# Patient Record
Sex: Male | Born: 1985 | Race: White | Hispanic: No | Marital: Single | Smoking: Never smoker
Health system: Southern US, Community
[De-identification: ages and names within clinical notes are randomized; demographics above are authoritative.]

## PROBLEM LIST (undated history)

## (undated) DIAGNOSIS — J45909 Unspecified asthma, uncomplicated: Secondary | ICD-10-CM

## (undated) HISTORY — DX: Unspecified asthma, uncomplicated: J45.909

---
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
## 2016-07-31 HISTORY — PX: REFRACTIVE SURGERY: SHX103

## 2019-01-01 ENCOUNTER — Other Ambulatory Visit: Payer: Self-pay

## 2019-01-01 ENCOUNTER — Ambulatory Visit (INDEPENDENT_AMBULATORY_CARE_PROVIDER_SITE_OTHER): Payer: Managed Care, Other (non HMO) | Admitting: Allergy

## 2019-01-01 ENCOUNTER — Encounter: Payer: Self-pay | Admitting: Allergy

## 2019-01-01 VITALS — BP 110/60 | HR 69 | Temp 98.3°F | Resp 16 | Ht 69.0 in | Wt 150.0 lb

## 2019-01-01 DIAGNOSIS — J302 Other seasonal allergic rhinitis: Secondary | ICD-10-CM

## 2019-01-01 DIAGNOSIS — J3089 Other allergic rhinitis: Secondary | ICD-10-CM | POA: Diagnosis not present

## 2019-01-01 DIAGNOSIS — Z8709 Personal history of other diseases of the respiratory system: Secondary | ICD-10-CM

## 2019-01-01 MED ORDER — EPINEPHRINE 0.3 MG/0.3ML IJ SOAJ: 0.3000 mg | Freq: Once | INTRAMUSCULAR | 2 refills | Status: AC

## 2019-01-01 NOTE — Progress Notes (Signed)
New Patient Note  RE: Matthew Jensen MRN: [id number] DOB: [birth year] Date of Office Visit: 01/01/2019  Referring provider: No ref. provider found Primary care provider: Patient, No Pcp Per  Chief Complaint: Allergic Rhinitis  (continue allergy shots )  History of Present Illness: I had the pleasure of seeing Matthew Jensen for initial evaluation at the [facility] on 01/02/2019. He is a 33 y.o. male, who is self-referred here for the evaluation of establishing care with allergist as he recently moved to the area.   Allergic rhinitis:  Patient has been on allergy injections for about 1.5 years with good benefit - started in October 2018. No reactions to the allergy injections. Last injection was on 12/04/2018 and was on the monthly injection for the past 6 months or so and is due for injection today. Patient was on allergy injections as a child for 1 year perhaps as well.   He reports symptoms of nasal congestion, PND. Symptoms have been going on for 20+ years. The symptoms are present all year around with worsening in spring. Other triggers include exposure to dust. Anosmia: no. Headache: sometimes. He has used Flonase 1 spray daily with fair improvement in symptoms. Used to take Singulair in the past. Sinus infections: none in the past year. Previous work up includes: skin testing in 2018 was positive to trees, grass, weed, ragweed, mold, dust mites, cat, dog, cockroach. Previous ENT evaluation: not recently. Previous sinus imaging: no. Last eye exam: over 1 year ago. History of nasal polyps: no.  Red vial 1:100 T, G, W Red vial 1:100 MT, MD, C, D, Cr  Assessment and Plan: Matthew Jensen is a 33 y.o. male with: Seasonal and perennial allergic rhinitis Perennial rhinitis symptoms for the past 20+ years with worsening in the spring.  2018 skin testing was positive to trees, grass, weed, ragweed, mold, dust mites, cat, dog and cockroach.  Started on allergy injections in  October 2018 and has been doing well on it with no reactions.  On monthly injections for the past 6 months (T,G,W + DM,M,C,D,Cr)  No indication for any repeat testing - see scanned records from previous allergist.   Will continue allergy immunotherapy. Gave injection today from his old vials.  Consent form was signed. Reviewed risks and benefits.  I have prescribed epinephrine injectable and demonstrated proper use. For mild symptoms you can take over the counter antihistamines such as Benadryl and monitor symptoms closely. If symptoms worsen or if you have severe symptoms including breathing issues, throat closure, significant swelling, whole body hives, severe diarrhea and vomiting, lightheadedness then inject epinephrine and seek immediate medical care afterwards.  Discussed that we will need to do a few months of build up with our vial before resuming every 4 week injections as some of our serums are different than his previous allergist.  Will start with Gold vial on schedule B. Once reached red vial 0.36ml then may resume every 4 weeks injections.   Continue Flonase 1 spray 1-2 times a day for nasal congestion. Demonstrated proper use.   Nasal saline spray (i.e., Simply Saline) or nasal saline lavage (i.e., NeilMed) is recommended as needed and prior to medicated nasal sprays.  May use over the counter antihistamines such as Zyrtec (cetirizine), Claritin (loratadine), Allegra (fexofenadine), or Xyzal (levocetirizine) daily as needed.  Start environmental control measures for pollen, dust mites, mold, cockroaches, pet dander.  History of asthma History of asthma as a child which has been dormant until diagnosed with  pneumonia a few years ago and required albuterol during that time.  Today's spirometry was normal.  May use albuterol rescue inhaler 2 puffs or nebulizer every 4 to 6 hours as needed for shortness of breath, chest tightness, coughing, and wheezing. May use albuterol rescue  inhaler 2 puffs 5 to 15 minutes prior to strenuous physical activities. Monitor frequency of use.   Return in about 6 months (around 07/03/2019).  Meds ordered this encounter  Medications  . EPINEPHrine (EPIPEN 2-PAK) 0.3 mg/0.3 mL IJ SOAJ injection    Sig: Inject 0.3 mLs (0.3 mg total) into the muscle once for 1 dose.    Dispense:  2 Device    Refill:  2   Other allergy screening: Asthma: used to have asthma as a child  Asymptomatic until he had pneumonia. Denies any SOB, coughing, wheezing, chest tightness, nocturnal awakenings.  Rhino conjunctivitis: yes Food allergy: no Medication allergy: no Hymenoptera allergy: no Urticaria: no Eczema:no History of recurrent infections suggestive of immunodeficency: no  Diagnostics: Spirometry:  Tracings reviewed. His effort: Good reproducible efforts. FVC: 4.89L FEV1: 3.46L, 80% predicted FEV1/FVC ratio: 71% Interpretation: Spirometry consistent with normal pattern.  Please see scanned spirometry results for details.  Past Medical History: Patient Active Problem List   Diagnosis Date Noted  . Seasonal and perennial allergic rhinitis 01/01/2019  . History of asthma 01/01/2019   Past Medical History:  Diagnosis Date  . Asthma    Past Surgical History: History reviewed. No pertinent surgical history. Medication List:  Current Outpatient Medications  Medication Sig Dispense Refill  . fluticasone (FLONASE) 50 MCG/ACT nasal spray Place 1 spray into both nostrils daily.    Marland Kitchen guaiFENesin (MUCINEX) 600 MG 12 hr tablet Take 600 mg by mouth 2 (two) times daily.     No current facility-administered medications for this visit.    Allergies: No Known Allergies Social History: Social History   Socioeconomic History  . Marital status: Single    Spouse name: Not on file  . Number of children: Not on file  . Years of education: Not on file  . Highest education level: Not on file  Occupational History  . Not on file  Social Needs   . Financial resource strain: Not on file  . Food insecurity:    Worry: Not on file    Inability: Not on file  . Transportation needs:    Medical: Not on file    Non-medical: Not on file  Tobacco Use  . Smoking status: Never Smoker  . Smokeless tobacco: Never Used  Substance and Sexual Activity  . Alcohol use: Never    Frequency: Never  . Drug use: Never  . Sexual activity: Never  Lifestyle  . Physical activity:    Days per week: Not on file    Minutes per session: Not on file  . Stress: Not on file  Relationships  . Social connections:    Talks on phone: Not on file    Gets together: Not on file    Attends religious service: Not on file    Active member of club or organization: Not on file    Attends meetings of clubs or organizations: Not on file    Relationship status: Not on file  Other Topics Concern  . Not on file  Social History Narrative  . Not on file   Lives in a newer apartment. Smoking: denies Occupation: Facilities manager History: Water Damage/mildew in the house: no Carpet in the family room: no  Carpet in the bedroom: yes Heating: electric Cooling: central Pet: yes 1 cat x 1.5 year ago  Family History: Family History  Problem Relation Age of Onset  . Asthma Mother   . Asthma Brother    Problem                               Relation Asthma                                   Mother, brother Eczema                                No  Food allergy                          No  Allergic rhino conjunctivitis     Father, mother   Review of Systems  Constitutional: Negative for appetite change, chills, fever and unexpected weight change.  HENT: Positive for congestion. Negative for rhinorrhea.   Eyes: Negative for itching.  Respiratory: Negative for cough, chest tightness, shortness of breath and wheezing.   Cardiovascular: Negative for chest pain.  Gastrointestinal: Negative for abdominal pain.  Genitourinary: Negative for difficulty  urinating.  Skin: Negative for rash.  Allergic/Immunologic: Positive for environmental allergies. Negative for food allergies.  Neurological: Positive for headaches.   Objective: BP 110/60   Pulse 69   Temp 98.3 F (36.8 C) (Tympanic)   Resp 16   Ht 5\' 9"  (1.753 m)   Wt 150 lb (68 kg)   SpO2 97%   BMI 22.15 kg/m  Body mass index is 22.15 kg/m. Physical Exam  Constitutional: He is oriented to person, place, and time. He appears well-developed and well-nourished.  HENT:  Head: Normocephalic and atraumatic.  Right Ear: External ear normal.  Left Ear: External ear normal.  Nose: Nose normal.  Mouth/Throat: Oropharynx is clear and moist.  Eyes: Conjunctivae and EOM are normal.  Neck: Neck supple.  Cardiovascular: Normal rate, regular rhythm and normal heart sounds. Exam reveals no gallop and no friction rub.  No murmur heard. Pulmonary/Chest: Effort normal and breath sounds normal. He has no wheezes. He has no rales.  Abdominal: Soft.  Neurological: He is alert and oriented to person, place, and time.  Skin: Skin is warm. No rash noted.  Psychiatric: He has a normal mood and affect. His behavior is normal.  Nursing note and vitals reviewed.  The plan was reviewed with the patient/family, and all questions/concerned were addressed.  It was my pleasure to see Matthew NeedleMichael today and participate in his care. Please feel free to contact me with any questions or concerns.  Sincerely,  Wyline MoodYoon Seraphim Trow, DO Allergy & Immunology  [facility] [place] [place] office: [phone] [facility] office: [phone]

## 2019-01-01 NOTE — Patient Instructions (Addendum)
We will continue your allergy shots here. Will need 1-2 months of build up most likely.  I have prescribed epinephrine injectable and demonstrated proper use. For mild symptoms you can take over the counter antihistamines such as Benadryl and monitor symptoms closely. If symptoms worsen or if you have severe symptoms including breathing issues, throat closure, significant swelling, whole body hives, severe diarrhea and vomiting, lightheadedness then inject epinephrine and seek immediate medical care afterwards.  Continue Flonase 1 spray 1-2 times a day for nasal congestion. Nasal saline spray (i.e., Simply Saline) or nasal saline lavage (i.e., NeilMed) is recommended as needed and prior to medicated nasal sprays. May use over the counter antihistamines such as Zyrtec (cetirizine), Claritin (loratadine), Allegra (fexofenadine), or Xyzal (levocetirizine) daily as needed. Start environmental control measures for pollen, dust mites, mold, cockroaches, pet dander.  May use albuterol rescue inhaler 2 puffs or nebulizer every 4 to 6 hours as needed for shortness of breath, chest tightness, coughing, and wheezing. May use albuterol rescue inhaler 2 puffs 5 to 15 minutes prior to strenuous physical activities. Monitor frequency of use.    Follow up in 6 months - sooner if needed.  Reducing Pollen Exposure . Pollen seasons: trees (spring), grass (summer) and ragweed/weeds (fall). Marland Kitchen. Keep windows closed in your home and car to lower pollen exposure.  Lilian Kapur. Install air conditioning in the bedroom and throughout the house if possible.  . Avoid going out in dry windy days - especially early morning. . Pollen counts are highest between 5 - 10 AM and on dry, hot and windy days.  . Save outside activities for late afternoon or after a heavy rain, when pollen levels are lower.  . Avoid mowing of grass if you have grass pollen allergy. Marland Kitchen. Be aware that pollen can also be transported indoors on people and pets.  . Dry  your clothes in an automatic dryer rather than hanging them outside where they might collect pollen.  . Rinse hair and eyes before bedtime.  Control of House Dust Mite Allergen . Dust mite allergens are a common trigger of allergy and asthma symptoms. While they can be found throughout the house, these microscopic creatures thrive in warm, humid environments such as bedding, upholstered furniture and carpeting. . Because so much time is spent in the bedroom, it is essential to reduce mite levels there.  . Encase pillows, mattresses, and box springs in special allergen-proof fabric covers or airtight, zippered plastic covers.  . Bedding should be washed weekly in hot water (130 F) and dried in a hot dryer. Allergen-proof covers are available for comforters and pillows that can't be regularly washed.  Reyes Ivan. Wash the allergy-proof covers every few months. Minimize clutter in the bedroom. Keep pets out of the bedroom.  Marland Kitchen. Keep humidity less than 50% by using a dehumidifier or air conditioning. You can buy a humidity measuring device called a hygrometer to monitor this.  . If possible, replace carpets with hardwood, linoleum, or washable area rugs. If that's not possible, vacuum frequently with a vacuum that has a HEPA filter. . Remove all upholstered furniture and non-washable window drapes from the bedroom. . Remove all non-washable stuffed toys from the bedroom.  Wash stuffed toys weekly. Mold Control . Mold and fungi can grow on a variety of surfaces provided certain temperature and moisture conditions exist.  . Outdoor molds grow on plants, decaying vegetation and soil. The major outdoor mold, Alternaria and Cladosporium, are found in very high numbers during hot and  dry conditions. Generally, a late summer - fall peak is seen for common outdoor fungal spores. Rain will temporarily lower outdoor mold spore count, but counts rise rapidly when the rainy period ends. . The most important indoor molds are  Aspergillus and Penicillium. Dark, humid and poorly ventilated basements are ideal sites for mold growth. The next most common sites of mold growth are the bathroom and the kitchen. Outdoor (Seasonal) Mold Control . Use air conditioning and keep windows closed. . Avoid exposure to decaying vegetation. Marland Kitchen Avoid leaf raking. . Avoid grain handling. . Consider wearing a face mask if working in moldy areas.  Indoor (Perennial) Mold Control  . Maintain humidity below 50%. . Get rid of mold growth on hard surfaces with water, detergent and, if necessary, 5% bleach (do not mix with other cleaners). Then dry the area completely. If mold covers an area more than 10 square feet, consider hiring an indoor environmental professional. . For clothing, washing with soap and water is best. If moldy items cannot be cleaned and dried, throw them away. . Remove sources e.g. contaminated carpets. . Repair and seal leaking roofs or pipes. Using dehumidifiers in damp basements may be helpful, but empty the water and clean units regularly to prevent mildew from forming. All rooms, especially basements, bathrooms and kitchens, require ventilation and cleaning to deter mold and mildew growth. Avoid carpeting on concrete or damp floors, and storing items in damp areas. Cockroach Allergen Avoidance Cockroaches are often found in the homes of densely populated urban areas, schools or commercial buildings, but these creatures can lurk almost anywhere. This does not mean that you have a dirty house or living area. . Block all areas where roaches can enter the home. This includes crevices, wall cracks and windows.  . Cockroaches need water to survive, so fix and seal all leaky faucets and pipes. Have an exterminator go through the house when your family and pets are gone to eliminate any remaining roaches. Marland Kitchen Keep food in lidded containers and put pet food dishes away after your pets are done eating. Vacuum and sweep the floor  after meals, and take out garbage and recyclables. Use lidded garbage containers in the kitchen. Wash dishes immediately after use and clean under stoves, refrigerators or toasters where crumbs can accumulate. Wipe off the stove and other kitchen surfaces and cupboards regularly. Pet Allergen Avoidance: . Contrary to popular opinion, there are no "hypoallergenic" breeds of dogs or cats. That is because people are not allergic to an animal's hair, but to an allergen found in the animal's saliva, dander (dead skin flakes) or urine. Pet allergy symptoms typically occur within minutes. For some people, symptoms can build up and become most severe 8 to 12 hours after contact with the animal. People with severe allergies can experience reactions in public places if dander has been transported on the pet owners' clothing. Marland Kitchen Keeping an animal outdoors is only a partial solution, since homes with pets in the yard still have higher concentrations of animal allergens. . Before getting a pet, ask your allergist to determine if you are allergic to animals. If your pet is already considered part of your family, try to minimize contact and keep the pet out of the bedroom and other rooms where you spend a great deal of time. . As with dust mites, vacuum carpets often or replace carpet with a hardwood floor, tile or linoleum. . High-efficiency particulate air (HEPA) cleaners can reduce allergen levels over time. . While  dander and saliva are the source of cat and dog allergens, urine is the source of allergens from rabbits, hamsters, mice and [place] pigs; so ask a non-allergic family member to clean the animal's cage. . If you have a pet allergy, talk to your allergist about the potential for allergy immunotherapy (allergy shots). This strategy can often provide long-term relief.

## 2019-01-02 ENCOUNTER — Encounter: Payer: Self-pay | Admitting: Allergy

## 2019-01-02 NOTE — Assessment & Plan Note (Addendum)
Perennial rhinitis symptoms for the past 20+ years with worsening in the spring.  2018 skin testing was positive to trees, grass, weed, ragweed, mold, dust mites, cat, dog and cockroach.  Started on allergy injections in October 2018 and has been doing well on it with no reactions.  On monthly injections for the past 6 months (T,G,W + DM,M,C,D,Cr)  No indication for any repeat testing - see scanned records from previous allergist.   Will continue allergy immunotherapy. Gave injection today from his old vials.  Consent form was signed. Reviewed risks and benefits.  I have prescribed epinephrine injectable and demonstrated proper use. For mild symptoms you can take over the counter antihistamines such as Benadryl and monitor symptoms closely. If symptoms worsen or if you have severe symptoms including breathing issues, throat closure, significant swelling, whole body hives, severe diarrhea and vomiting, lightheadedness then inject epinephrine and seek immediate medical care afterwards.  Discussed that we will need to do a few months of build up with our vial before resuming every 4 week injections as some of our serums are different than his previous allergist.  Will start with Gold vial on schedule B. Once reached red vial 0.87ml then may resume every 4 weeks injections.   Continue Flonase 1 spray 1-2 times a day for nasal congestion. Demonstrated proper use.   Nasal saline spray (i.e., Simply Saline) or nasal saline lavage (i.e., NeilMed) is recommended as needed and prior to medicated nasal sprays.  May use over the counter antihistamines such as Zyrtec (cetirizine), Claritin (loratadine), Allegra (fexofenadine), or Xyzal (levocetirizine) daily as needed.  Start environmental control measures for pollen, dust mites, mold, cockroaches, pet dander.

## 2019-01-02 NOTE — Assessment & Plan Note (Signed)
History of asthma as a child which has been dormant until diagnosed with pneumonia a few years ago and required albuterol during that time.  Today's spirometry was normal.  May use albuterol rescue inhaler 2 puffs or nebulizer every 4 to 6 hours as needed for shortness of breath, chest tightness, coughing, and wheezing. May use albuterol rescue inhaler 2 puffs 5 to 15 minutes prior to strenuous physical activities. Monitor frequency of use.

## 2019-01-16 DIAGNOSIS — J301 Allergic rhinitis due to pollen: Secondary | ICD-10-CM | POA: Diagnosis not present

## 2019-01-16 NOTE — Progress Notes (Signed)
Mar-Mac EXP 01-16-2020

## 2019-01-22 DIAGNOSIS — J3089 Other allergic rhinitis: Secondary | ICD-10-CM | POA: Diagnosis not present

## 2019-01-23 ENCOUNTER — Ambulatory Visit (INDEPENDENT_AMBULATORY_CARE_PROVIDER_SITE_OTHER): Payer: Managed Care, Other (non HMO)

## 2019-01-23 ENCOUNTER — Other Ambulatory Visit: Payer: Self-pay

## 2019-01-23 DIAGNOSIS — J309 Allergic rhinitis, unspecified: Secondary | ICD-10-CM | POA: Diagnosis not present

## 2019-01-23 MED ORDER — EPINEPHRINE 0.3 MG/0.3ML IJ SOAJ: 0.3000 mg | Freq: Once | INTRAMUSCULAR | 1 refills | Status: AC

## 2019-01-23 NOTE — Progress Notes (Signed)
Immunotherapy   Patient Details  Name: Matthew Jensen MRN: [id number] Date of Birth: 11/29/1985  01/23/2019  Edythe Clarity  Started allergy injections. Patient received 0.05 of both his yellow/gold vials. One with G-W-T and the other with M-DM-C-D-CR. Patient waited 30 minutes in an exam room with no problems. Following schedule: B Frequency: 1-2 tines weekly Epi-Pen: Yes Consent signed and patient instructions given.   Herbie Drape 01/23/2019, 2:29 PM

## 2019-01-28 ENCOUNTER — Ambulatory Visit (INDEPENDENT_AMBULATORY_CARE_PROVIDER_SITE_OTHER): Payer: Managed Care, Other (non HMO) | Admitting: *Deleted

## 2019-01-28 DIAGNOSIS — J309 Allergic rhinitis, unspecified: Secondary | ICD-10-CM

## 2019-02-04 ENCOUNTER — Ambulatory Visit (INDEPENDENT_AMBULATORY_CARE_PROVIDER_SITE_OTHER): Payer: Managed Care, Other (non HMO) | Admitting: *Deleted

## 2019-02-04 DIAGNOSIS — J309 Allergic rhinitis, unspecified: Secondary | ICD-10-CM | POA: Diagnosis not present

## 2019-02-11 ENCOUNTER — Ambulatory Visit (INDEPENDENT_AMBULATORY_CARE_PROVIDER_SITE_OTHER): Payer: Managed Care, Other (non HMO) | Admitting: *Deleted

## 2019-02-11 DIAGNOSIS — J309 Allergic rhinitis, unspecified: Secondary | ICD-10-CM

## 2019-02-21 ENCOUNTER — Ambulatory Visit (INDEPENDENT_AMBULATORY_CARE_PROVIDER_SITE_OTHER): Payer: Managed Care, Other (non HMO) | Admitting: *Deleted

## 2019-02-21 DIAGNOSIS — J309 Allergic rhinitis, unspecified: Secondary | ICD-10-CM

## 2019-02-27 ENCOUNTER — Ambulatory Visit (INDEPENDENT_AMBULATORY_CARE_PROVIDER_SITE_OTHER): Payer: Managed Care, Other (non HMO)

## 2019-02-27 DIAGNOSIS — J309 Allergic rhinitis, unspecified: Secondary | ICD-10-CM | POA: Diagnosis not present

## 2019-03-04 ENCOUNTER — Ambulatory Visit (INDEPENDENT_AMBULATORY_CARE_PROVIDER_SITE_OTHER): Payer: Managed Care, Other (non HMO) | Admitting: *Deleted

## 2019-03-04 DIAGNOSIS — J309 Allergic rhinitis, unspecified: Secondary | ICD-10-CM

## 2019-03-11 ENCOUNTER — Ambulatory Visit (INDEPENDENT_AMBULATORY_CARE_PROVIDER_SITE_OTHER): Payer: Managed Care, Other (non HMO) | Admitting: *Deleted

## 2019-03-11 DIAGNOSIS — J309 Allergic rhinitis, unspecified: Secondary | ICD-10-CM

## 2019-03-18 ENCOUNTER — Ambulatory Visit (INDEPENDENT_AMBULATORY_CARE_PROVIDER_SITE_OTHER): Payer: Managed Care, Other (non HMO) | Admitting: *Deleted

## 2019-03-18 DIAGNOSIS — J309 Allergic rhinitis, unspecified: Secondary | ICD-10-CM

## 2019-03-25 ENCOUNTER — Ambulatory Visit (INDEPENDENT_AMBULATORY_CARE_PROVIDER_SITE_OTHER): Payer: Managed Care, Other (non HMO) | Admitting: *Deleted

## 2019-03-25 DIAGNOSIS — J309 Allergic rhinitis, unspecified: Secondary | ICD-10-CM

## 2019-04-03 ENCOUNTER — Ambulatory Visit (INDEPENDENT_AMBULATORY_CARE_PROVIDER_SITE_OTHER): Payer: Managed Care, Other (non HMO) | Admitting: *Deleted

## 2019-04-03 ENCOUNTER — Other Ambulatory Visit: Payer: Self-pay

## 2019-04-03 ENCOUNTER — Emergency Department (HOSPITAL_COMMUNITY): Payer: Managed Care, Other (non HMO)

## 2019-04-03 ENCOUNTER — Emergency Department (HOSPITAL_COMMUNITY)
Admission: EM | Admit: 2019-04-03 | Discharge: 2019-04-03 | Disposition: A | Discharge status: Home / Self Care | Payer: Managed Care, Other (non HMO) | Attending: Emergency Medicine | Admitting: Emergency Medicine

## 2019-04-03 DIAGNOSIS — R001 Bradycardia, unspecified: Secondary | ICD-10-CM | POA: Diagnosis not present

## 2019-04-03 DIAGNOSIS — J309 Allergic rhinitis, unspecified: Secondary | ICD-10-CM

## 2019-04-03 DIAGNOSIS — Z79899 Other long term (current) drug therapy: Secondary | ICD-10-CM | POA: Insufficient documentation

## 2019-04-03 DIAGNOSIS — J45909 Unspecified asthma, uncomplicated: Secondary | ICD-10-CM | POA: Diagnosis not present

## 2019-04-03 DIAGNOSIS — R0789 Other chest pain: Secondary | ICD-10-CM

## 2019-04-03 LAB — BASIC METABOLIC PANEL
Anion gap: 10 (ref 5–15)
BUN: 17 mg/dL (ref 6–20)
CO2: 26 mmol/L (ref 22–32)
Calcium: 9.2 mg/dL (ref 8.9–10.3)
Chloride: 103 mmol/L (ref 98–111)
Creatinine, Ser: 0.89 mg/dL (ref 0.61–1.24)
GFR calc Af Amer: >60 mL/min (ref >60)
GFR calc non Af Amer: >60 mL/min (ref >60)
Glucose, Bld: 92 mg/dL (ref 70–99)
Potassium: 4 mmol/L (ref 3.5–5.1)
Sodium: 139 mmol/L (ref 135–145)

## 2019-04-03 LAB — TROPONIN I (HIGH SENSITIVITY)
Troponin I (High Sensitivity): 4 ng/L (ref <18)
Troponin I (High Sensitivity): 4 ng/L (ref <18)

## 2019-04-03 LAB — CBC
HCT: 42.9 % (ref 39.0–52.0)
Hemoglobin: 13.9 g/dL (ref 13.0–17.0)
MCH: 31.6 pg (ref 26.0–34.0)
MCHC: 32.4 g/dL (ref 30.0–36.0)
MCV: 97.5 fL (ref 80.0–100.0)
Platelets: 151 10*3/uL (ref 150–400)
RBC: 4.4 MIL/uL (ref 4.22–5.81)
RDW: 12.3 % (ref 11.5–15.5)
WBC: 5.2 10*3/uL (ref 4.0–10.5)
nRBC: 0 % (ref 0.0–0.2)

## 2019-04-03 MED ORDER — SODIUM CHLORIDE 0.9% FLUSH: 3.0000 mL | Freq: Once | INTRAVENOUS | Status: DC

## 2019-04-03 NOTE — ED Triage Notes (Signed)
Patient with chest pain that started last night after scuba diving last night.  Patient stated that it did go away around 2030 last night.  Pain returned this morning after going to the bathroom around 0430.  It is sharp in nature, with a pressure under left breast/substernal.  No shortness of breath, no nausea or vomiting.

## 2019-04-03 NOTE — ED Notes (Signed)
Patient Alert and oriented to baseline. Stable and ambulatory to baseline. Patient verbalized understanding of the discharge instructions.  Patient belongings were taken by the patient. Ambulated to lobby without complication.

## 2019-04-03 NOTE — ED Provider Notes (Signed)
[facility]   CSN: [id number] Arrival date & time: 04/03/19  [id number]     History   Chief Complaint Chief Complaint  Patient presents with  . Chest Pain    HPI Emeline GinsMichael Diebel is a 33 y.o. male.     Pt reports he had a sharp pain in his left chest last pm after scuba diving.  Pt reports he was diving in an 8 foot pool. Pt reports he had pain last pm and then pain reoccurred this am.  No current pain.  Pt denies shortness of breath.  No fever, no chiils.  Pt reports he had a similar episode several years ago and had a stress test which was normal.  Pt denies any other complaint. No change with exertion. No medications taken   The history is provided by the patient. No language interpreter was used.    Past Medical History:  Diagnosis Date  . Asthma     Patient Active Problem List   Diagnosis Date Noted  . Seasonal and perennial allergic rhinitis 01/01/2019  . History of asthma 01/01/2019    No past surgical history on file.      Home Medications    Prior to Admission medications   Medication Sig Start Date End Date Taking? Authorizing Provider  fluticasone (FLONASE) 50 MCG/ACT nasal spray Place 1 spray into both nostrils daily.    [provider]  guaiFENesin (MUCINEX) 600 MG 12 hr tablet Take 600 mg by mouth 2 (two) times daily.    [provider]    Family History Family History  Problem Relation Age of Onset  . Asthma Mother   . Asthma Brother     Social History Social History   Tobacco Use  . Smoking status: Never Smoker  . Smokeless tobacco: Never Used  Substance Use Topics  . Alcohol use: Never    Frequency: Never  . Drug use: Never     Allergies   Patient has no known allergies.   Review of Systems Review of Systems  All other systems reviewed and are negative.    Physical Exam Updated Vital Signs BP 113/61   Pulse (!) 42   Temp 98.4 F (36.9 C) (Oral)   Resp 12    Ht 5\' 9"  (1.753 m)   Wt 68 kg   SpO2 100%   BMI 22.15 kg/m   Physical Exam Vitals signs and nursing note reviewed.  Constitutional:      Appearance: He is well-developed.  HENT:     Head: Normocephalic and atraumatic.  Eyes:     Conjunctiva/sclera: Conjunctivae normal.  Neck:     Musculoskeletal: Normal range of motion and neck supple.  Cardiovascular:     Rate and Rhythm: Regular rhythm. Bradycardia present.     Heart sounds: Normal heart sounds. No murmur.  Pulmonary:     Effort: Pulmonary effort is normal. No respiratory distress.     Breath sounds: Normal breath sounds. No decreased breath sounds.  Abdominal:     Palpations: Abdomen is soft.     Tenderness: There is no abdominal tenderness.  Musculoskeletal: Normal range of motion.  Skin:    General: Skin is warm and dry.  Neurological:     General: No focal deficit present.     Mental Status: He is alert.  Psychiatric:        Mood and Affect: Mood normal.      ED Treatments / Results  Labs (all  labs ordered are listed, but only abnormal results are displayed) Labs Reviewed  BASIC METABOLIC PANEL  CBC  TROPONIN I (HIGH SENSITIVITY)  TROPONIN I (HIGH SENSITIVITY)    EKG EKG Interpretation  Date/Time:  Thursday April 03 2019 06:38:17 EDT Ventricular Rate:  48 PR Interval:  178 QRS Duration: 100 QT Interval:  406 QTC Calculation: 362 R Axis:   95 Text Interpretation:  Sinus bradycardia with sinus arrhythmia Rightward axis Borderline ECG No old tracing to compare Confirmed by Daleen Bo [phone]) on 04/03/2019 9:32:01 AM   Radiology Dg Chest 2 View  Result Date: 04/03/2019 CLINICAL DATA:  Chest pain. Patient reports onset of chest pain after scuba diving last night. EXAM: CHEST - 2 VIEW COMPARISON:  None FINDINGS: The heart size and mediastinal contours are within normal limits. Both lungs are clear. The visualized skeletal structures are unremarkable. IMPRESSION: Negative one-view chest x-ray  Electronically Signed   By: San Morelle M.D.   On: 04/03/2019 07:03    Procedures Procedures (including critical care time)  Medications Ordered in ED Medications  sodium chloride flush (NS) 0.9 % injection 3 mL (has no administration in time range)     Initial Impression / Assessment and Plan / ED Course  I have reviewed the triage vital signs and the nursing notes.  Pertinent labs & imaging results that were available during my care of the patient were reviewed by me and considered in my medical decision making (see chart for details).        MDM  Pt has 2 negative troponins.  He is chest pain free.  Pt's chest xray is normal, labs are normal.  I doubt acute coronary issue.  Pt is not dizzy.  I spoke with Cardiology.  Pt will receive a call to schedule cardiology evaluation for braycardia   Final Clinical Impressions(s) / ED Diagnoses   Final diagnoses:  Atypical chest pain  Bradycardia    ED Discharge Orders    None    An After Visit Summary was printed and given to the patient.    Fransico Meadow, PA-C 04/03/19 1154    Daleen Bo, MD 04/05/19 [phone]

## 2019-04-03 NOTE — Discharge Instructions (Signed)
[facility] will call you to schedule appointment for evaluation.  Return if any problems.

## 2019-04-10 ENCOUNTER — Ambulatory Visit (INDEPENDENT_AMBULATORY_CARE_PROVIDER_SITE_OTHER): Payer: Managed Care, Other (non HMO) | Admitting: *Deleted

## 2019-04-10 DIAGNOSIS — J309 Allergic rhinitis, unspecified: Secondary | ICD-10-CM

## 2019-04-14 ENCOUNTER — Ambulatory Visit: Payer: Managed Care, Other (non HMO) | Admitting: Cardiology

## 2019-04-14 ENCOUNTER — Ambulatory Visit (INDEPENDENT_AMBULATORY_CARE_PROVIDER_SITE_OTHER): Payer: Managed Care, Other (non HMO) | Admitting: *Deleted

## 2019-04-14 DIAGNOSIS — J309 Allergic rhinitis, unspecified: Secondary | ICD-10-CM

## 2019-04-24 ENCOUNTER — Ambulatory Visit (INDEPENDENT_AMBULATORY_CARE_PROVIDER_SITE_OTHER): Payer: Managed Care, Other (non HMO)

## 2019-04-24 DIAGNOSIS — J309 Allergic rhinitis, unspecified: Secondary | ICD-10-CM

## 2019-05-01 ENCOUNTER — Ambulatory Visit (INDEPENDENT_AMBULATORY_CARE_PROVIDER_SITE_OTHER): Payer: Managed Care, Other (non HMO) | Admitting: *Deleted

## 2019-05-01 DIAGNOSIS — J309 Allergic rhinitis, unspecified: Secondary | ICD-10-CM | POA: Diagnosis not present

## 2019-05-05 ENCOUNTER — Ambulatory Visit (INDEPENDENT_AMBULATORY_CARE_PROVIDER_SITE_OTHER): Payer: Managed Care, Other (non HMO) | Admitting: *Deleted

## 2019-05-05 DIAGNOSIS — J309 Allergic rhinitis, unspecified: Secondary | ICD-10-CM | POA: Diagnosis not present

## 2019-05-15 ENCOUNTER — Ambulatory Visit (INDEPENDENT_AMBULATORY_CARE_PROVIDER_SITE_OTHER): Payer: Managed Care, Other (non HMO)

## 2019-05-15 DIAGNOSIS — J309 Allergic rhinitis, unspecified: Secondary | ICD-10-CM | POA: Diagnosis not present

## 2019-05-22 ENCOUNTER — Ambulatory Visit (INDEPENDENT_AMBULATORY_CARE_PROVIDER_SITE_OTHER): Payer: Managed Care, Other (non HMO) | Admitting: *Deleted

## 2019-05-22 DIAGNOSIS — J309 Allergic rhinitis, unspecified: Secondary | ICD-10-CM

## 2019-06-17 ENCOUNTER — Ambulatory Visit (INDEPENDENT_AMBULATORY_CARE_PROVIDER_SITE_OTHER): Payer: Managed Care, Other (non HMO)

## 2019-06-17 DIAGNOSIS — J309 Allergic rhinitis, unspecified: Secondary | ICD-10-CM

## 2019-07-18 ENCOUNTER — Ambulatory Visit (INDEPENDENT_AMBULATORY_CARE_PROVIDER_SITE_OTHER): Payer: Managed Care, Other (non HMO) | Admitting: *Deleted

## 2019-07-18 DIAGNOSIS — J309 Allergic rhinitis, unspecified: Secondary | ICD-10-CM

## 2019-08-12 ENCOUNTER — Ambulatory Visit (INDEPENDENT_AMBULATORY_CARE_PROVIDER_SITE_OTHER): Payer: Managed Care, Other (non HMO)

## 2019-08-12 DIAGNOSIS — J309 Allergic rhinitis, unspecified: Secondary | ICD-10-CM | POA: Diagnosis not present

## 2019-09-08 ENCOUNTER — Ambulatory Visit (INDEPENDENT_AMBULATORY_CARE_PROVIDER_SITE_OTHER): Payer: Managed Care, Other (non HMO)

## 2019-09-08 DIAGNOSIS — J309 Allergic rhinitis, unspecified: Secondary | ICD-10-CM | POA: Diagnosis not present

## 2019-09-24 DIAGNOSIS — J3089 Other allergic rhinitis: Secondary | ICD-10-CM

## 2019-09-24 NOTE — Progress Notes (Signed)
VIALS EXP 09-23-20 

## 2019-10-07 ENCOUNTER — Ambulatory Visit (INDEPENDENT_AMBULATORY_CARE_PROVIDER_SITE_OTHER): Payer: Managed Care, Other (non HMO)

## 2019-10-07 DIAGNOSIS — J309 Allergic rhinitis, unspecified: Secondary | ICD-10-CM | POA: Diagnosis not present

## 2019-11-06 ENCOUNTER — Ambulatory Visit (INDEPENDENT_AMBULATORY_CARE_PROVIDER_SITE_OTHER): Payer: Managed Care, Other (non HMO)

## 2019-11-06 DIAGNOSIS — J309 Allergic rhinitis, unspecified: Secondary | ICD-10-CM | POA: Diagnosis not present

## 2019-11-13 ENCOUNTER — Ambulatory Visit (INDEPENDENT_AMBULATORY_CARE_PROVIDER_SITE_OTHER): Payer: Managed Care, Other (non HMO)

## 2019-11-13 DIAGNOSIS — J309 Allergic rhinitis, unspecified: Secondary | ICD-10-CM | POA: Diagnosis not present

## 2019-11-21 ENCOUNTER — Ambulatory Visit (INDEPENDENT_AMBULATORY_CARE_PROVIDER_SITE_OTHER): Payer: Managed Care, Other (non HMO)

## 2019-11-21 DIAGNOSIS — J309 Allergic rhinitis, unspecified: Secondary | ICD-10-CM | POA: Diagnosis not present

## 2019-11-27 ENCOUNTER — Ambulatory Visit (INDEPENDENT_AMBULATORY_CARE_PROVIDER_SITE_OTHER): Payer: Managed Care, Other (non HMO)

## 2019-11-27 DIAGNOSIS — J309 Allergic rhinitis, unspecified: Secondary | ICD-10-CM

## 2019-12-04 ENCOUNTER — Ambulatory Visit (INDEPENDENT_AMBULATORY_CARE_PROVIDER_SITE_OTHER): Payer: Managed Care, Other (non HMO)

## 2019-12-04 DIAGNOSIS — J309 Allergic rhinitis, unspecified: Secondary | ICD-10-CM

## 2019-12-11 ENCOUNTER — Ambulatory Visit (INDEPENDENT_AMBULATORY_CARE_PROVIDER_SITE_OTHER): Payer: Managed Care, Other (non HMO)

## 2019-12-11 DIAGNOSIS — J309 Allergic rhinitis, unspecified: Secondary | ICD-10-CM | POA: Diagnosis not present

## 2019-12-16 ENCOUNTER — Ambulatory Visit (INDEPENDENT_AMBULATORY_CARE_PROVIDER_SITE_OTHER): Payer: Managed Care, Other (non HMO)

## 2019-12-16 DIAGNOSIS — J309 Allergic rhinitis, unspecified: Secondary | ICD-10-CM

## 2019-12-23 IMAGING — DX DG CHEST 2V
2 series · 2 of 2 positions shown · non-contrast
Comparison: None

CLINICAL DATA: Chest pain. Patient reports onset of chest pain
after scuba diving last night.

EXAM:
CHEST - 2 VIEW

[chest pa]
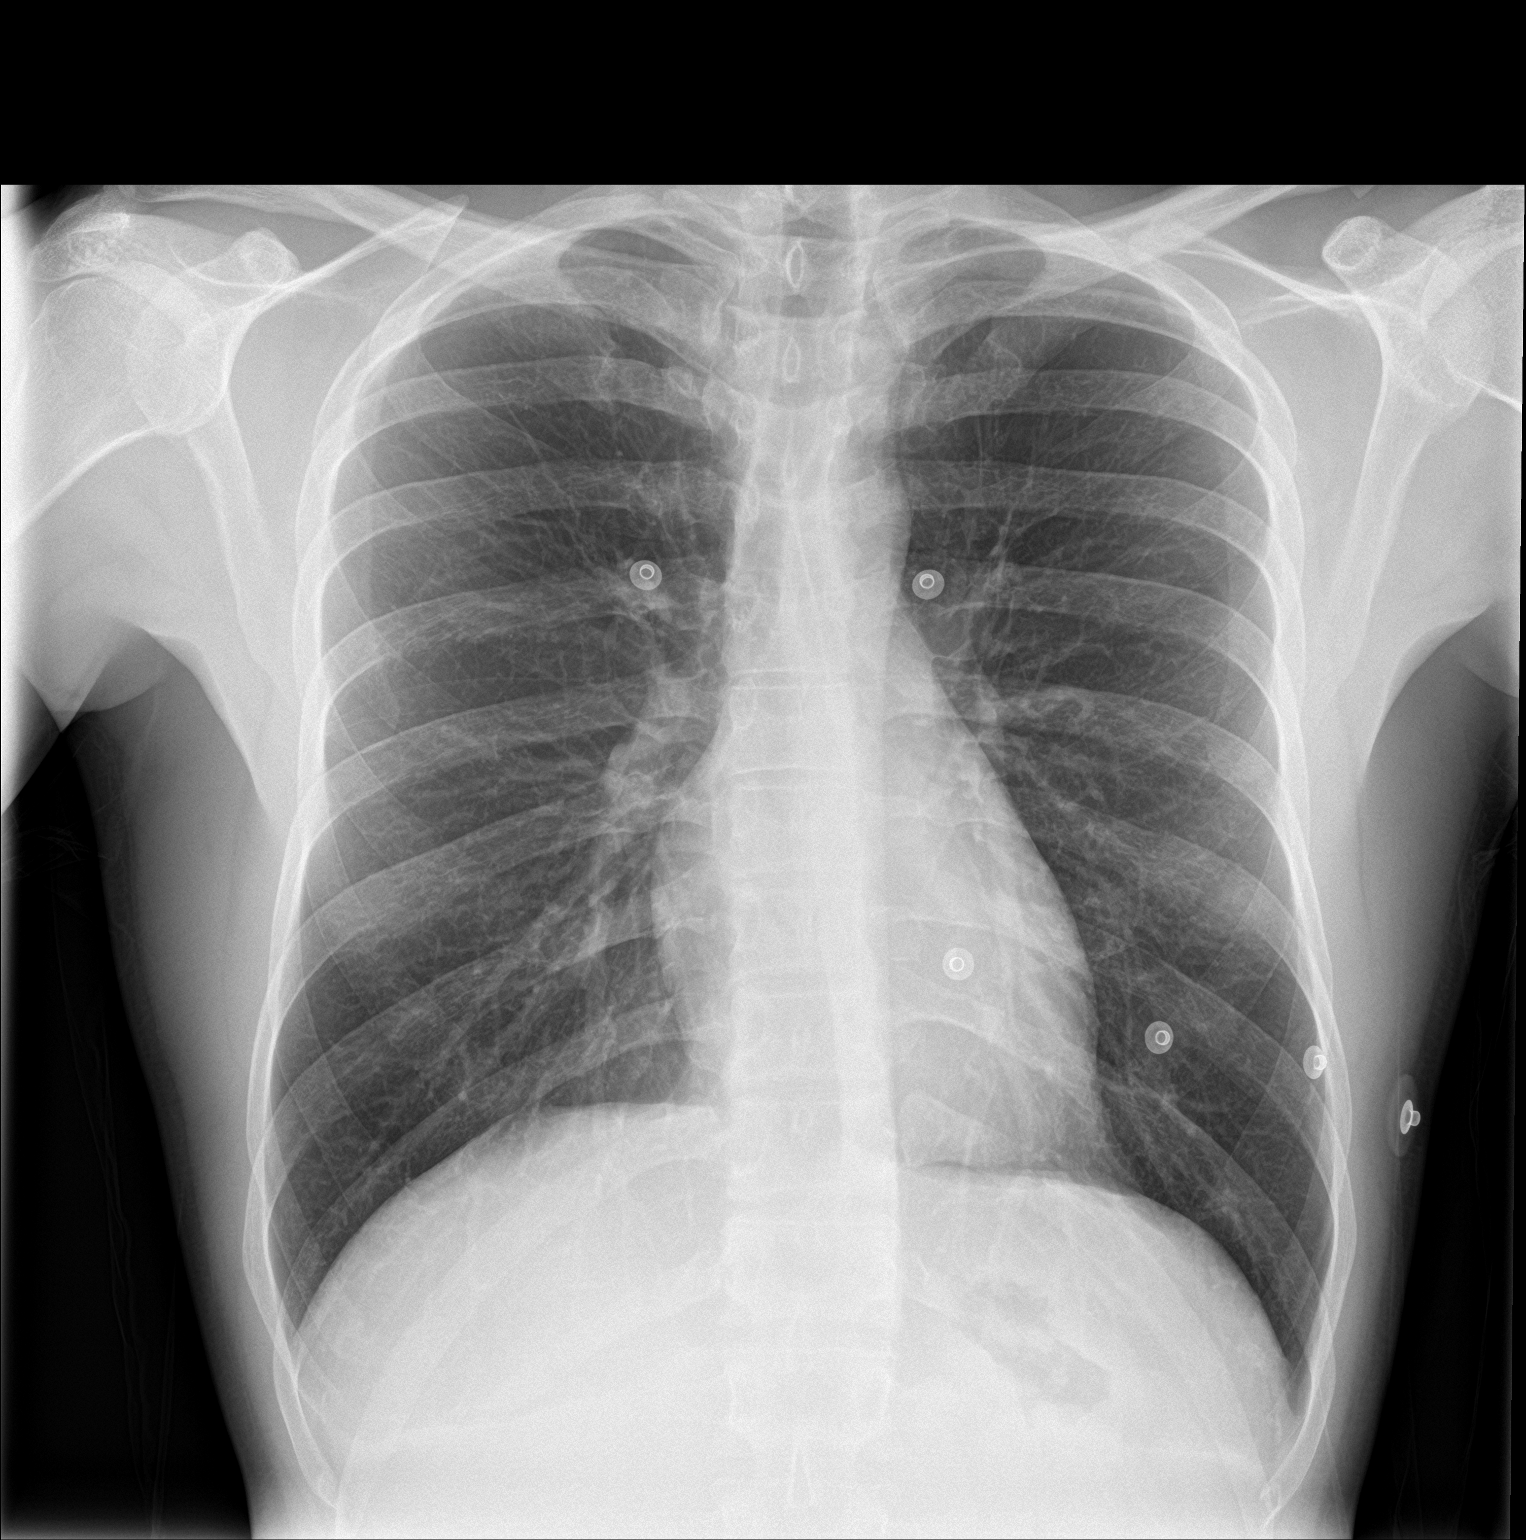

[chest lat]
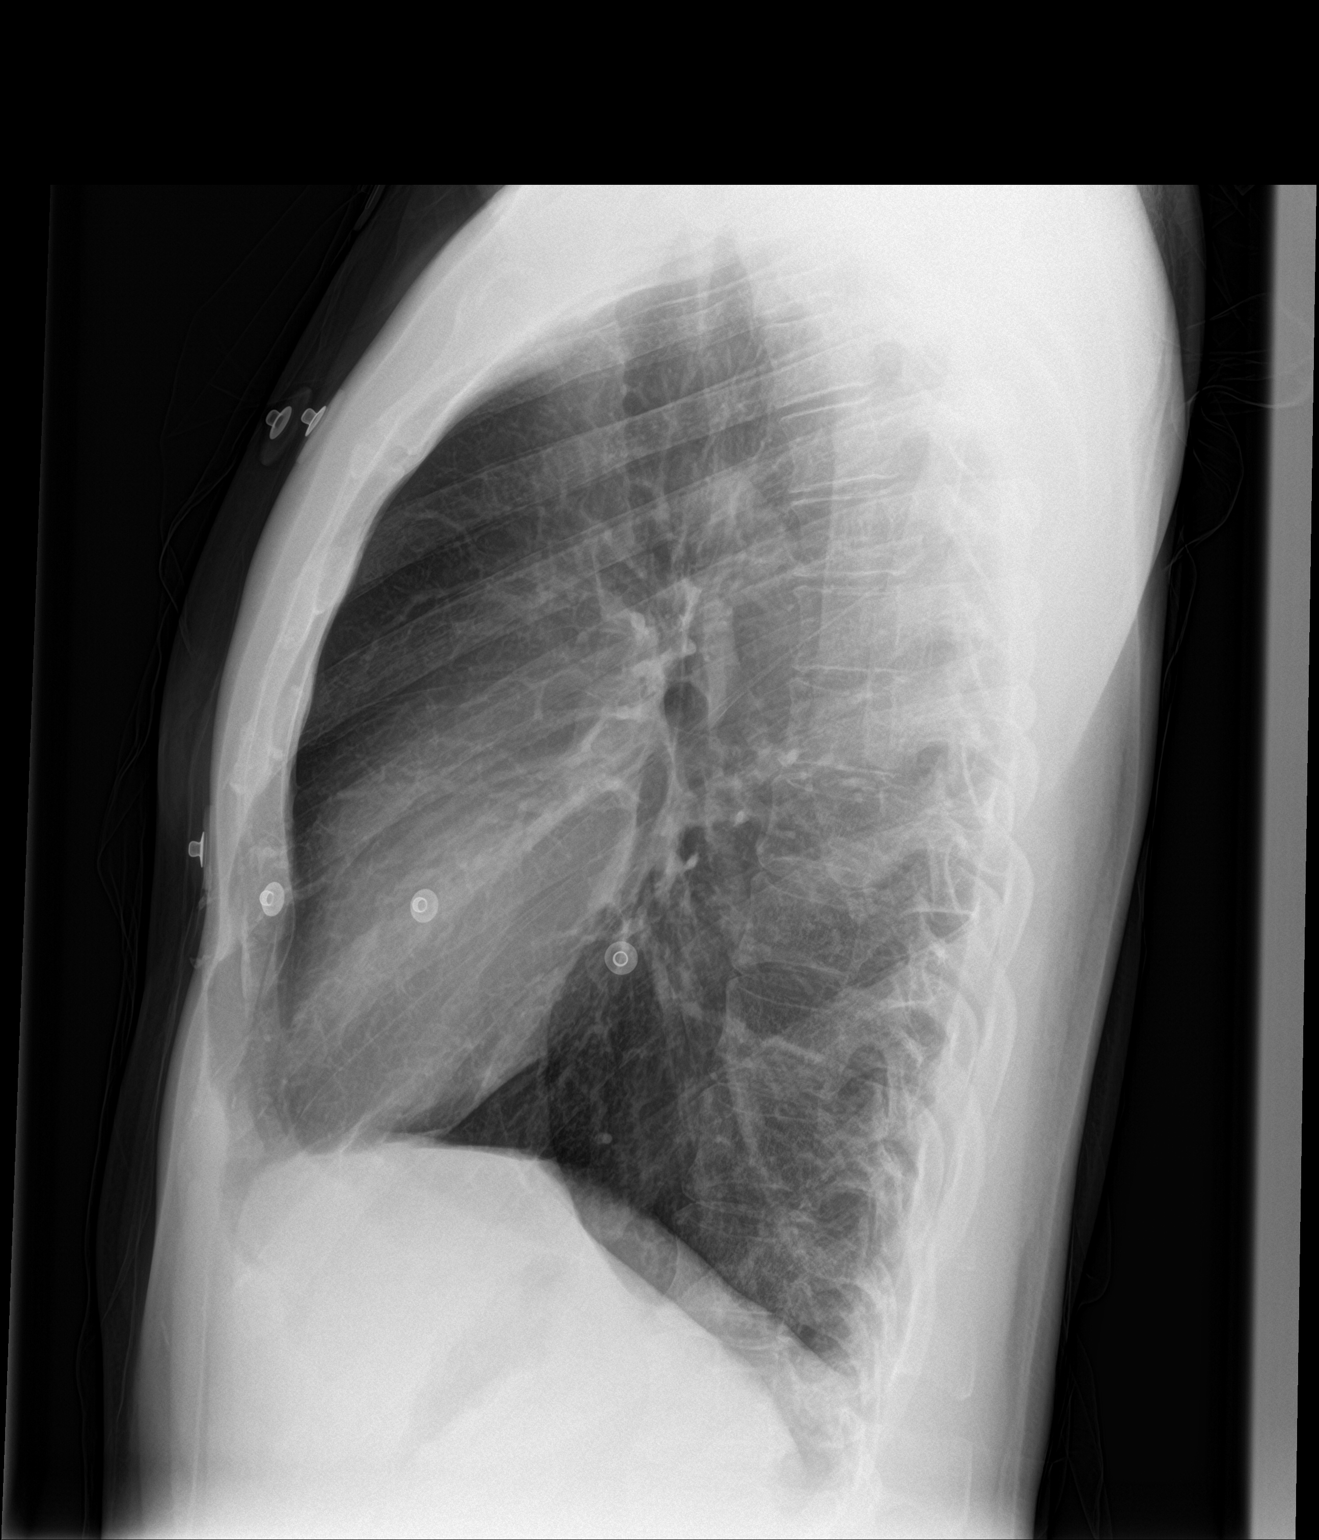

[2 of 2 positions shown; findings below may reference images not displayed]

FINDINGS: The heart size and mediastinal contours are within normal limits.
Both lungs are clear. The visualized skeletal structures are
unremarkable.
IMPRESSION: Negative one-view chest x-ray

## 2020-01-13 ENCOUNTER — Ambulatory Visit (INDEPENDENT_AMBULATORY_CARE_PROVIDER_SITE_OTHER): Payer: Managed Care, Other (non HMO)

## 2020-01-13 DIAGNOSIS — J309 Allergic rhinitis, unspecified: Secondary | ICD-10-CM

## 2020-01-26 NOTE — Progress Notes (Signed)
VIALS EXP 01-25-21 

## 2020-01-28 DIAGNOSIS — J3089 Other allergic rhinitis: Secondary | ICD-10-CM | POA: Diagnosis not present

## 2020-02-12 ENCOUNTER — Ambulatory Visit (INDEPENDENT_AMBULATORY_CARE_PROVIDER_SITE_OTHER): Payer: Managed Care, Other (non HMO)

## 2020-02-12 DIAGNOSIS — J309 Allergic rhinitis, unspecified: Secondary | ICD-10-CM

## 2020-03-11 ENCOUNTER — Ambulatory Visit (INDEPENDENT_AMBULATORY_CARE_PROVIDER_SITE_OTHER): Payer: Managed Care, Other (non HMO)

## 2020-03-11 DIAGNOSIS — J309 Allergic rhinitis, unspecified: Secondary | ICD-10-CM

## 2020-03-29 ENCOUNTER — Ambulatory Visit (INDEPENDENT_AMBULATORY_CARE_PROVIDER_SITE_OTHER): Payer: Managed Care, Other (non HMO) | Admitting: Allergy

## 2020-03-29 ENCOUNTER — Other Ambulatory Visit: Payer: Self-pay

## 2020-03-29 ENCOUNTER — Encounter: Payer: Self-pay | Admitting: Allergy

## 2020-03-29 VITALS — BP 100/60 | HR 52 | Temp 98.1°F | Resp 16 | Wt 150.0 lb

## 2020-03-29 DIAGNOSIS — Z8709 Personal history of other diseases of the respiratory system: Secondary | ICD-10-CM

## 2020-03-29 DIAGNOSIS — J302 Other seasonal allergic rhinitis: Secondary | ICD-10-CM | POA: Diagnosis not present

## 2020-03-29 DIAGNOSIS — Z8616 Personal history of COVID-19: Secondary | ICD-10-CM | POA: Diagnosis not present

## 2020-03-29 DIAGNOSIS — J3089 Other allergic rhinitis: Secondary | ICD-10-CM | POA: Diagnosis not present

## 2020-03-29 MED ORDER — MONTELUKAST SODIUM 10 MG PO TABS: 10.0000 mg | ORAL_TABLET | Freq: Every day | ORAL | 5 refills | Status: DC

## 2020-03-29 NOTE — Assessment & Plan Note (Addendum)
Past history - Perennial rhinitis symptoms for the past 20+ years with worsening in the spring.  2018 skin testing was positive to trees, grass, weed, ragweed, mold, dust mites, cat, dog and cockroach.  Started on allergy injections in October 2018 and has been doing well on it with no reactions.  On monthly injections for the past 6 months (T,G,W + DM,M,C,D,Cr) and restarted in our office on 01/23/2019. Interim history - still having nasal congestion and sometimes makes it difficult to scuba dive. No recent ENT evaluation or montelukast use. Only using Flonase 1 spray per nostril once a day.   Continue allergy injections for now - will re-assess at the next visit.  Try to use Flonase 1 spray per nostril twice a day for nasal congestion.  Nasal saline spray (i.e., Simply Saline) or nasal saline lavage (i.e., NeilMed) is recommended as needed and prior to medicated nasal sprays. Start Singulair (montelukast) 10mg  daily at night. Cautioned that in some children/adults can experience behavioral changes including hyperactivity, agitation, depression, sleep disturbances and suicidal ideations. These side effects are rare, but if you notice them you should notify me and discontinue Singulair (montelukast).  Continue environmental control measures.  May use over the counter antihistamines such as Zyrtec (cetirizine), Claritin (loratadine), Allegra (fexofenadine), or Xyzal (levocetirizine) daily as needed.  Refer to ENT for nasal congestion.

## 2020-03-29 NOTE — Assessment & Plan Note (Signed)
Past history -  asthma as a child which has been dormant until diagnosed with pneumonia a few years ago and required albuterol during that time. 2020 spirometry was normal. Interim history - asymptomatic.   May use albuterol rescue inhaler 2 puffs every 4 to 6 hours as needed for shortness of breath, chest tightness, coughing, and wheezing. May use albuterol rescue inhaler 2 puffs 5 to 15 minutes prior to strenuous physical activities. Monitor frequency of use.

## 2020-03-29 NOTE — Progress Notes (Signed)
Follow Up Note  RE: Matthew Jensen MRN: [id number] DOB: 02/29/1986 Date of Office Visit: 03/29/2020  Referring provider: No ref. provider found Primary care provider: Patient, No Pcp Per  Chief Complaint: Follow-up and Allergic Rhinitis  (some congestion, still getting allergy shots )  History of Present Illness: I had the pleasure of seeing Matthew Jensen for a follow up visit at the [facility] on 03/29/2020. He is a 34 y.o. male, who is being followed for allergic rhinitis on immunotherapy and history of asthma. His previous allergy office visit was on 01/01/2019 with Dr. Selena Batten. Today is a regular follow up visit.  Allergic rhinitis: Patient re-started allergy injections in June 2020 some localized reactions at times.  He is still having some nasal congestion. Currently on Flonase 1 spray per nostril QHS and sometimes takes Mucinex and decongestants as needed. No nosebleeds.  Not taking any antihistamines.  No recent Singulair use.   Patient had COVID-19 in January 2021 with no lasting symptoms.   Patient started scuba diving and having some issues with it due to his nasal congestion and unable to "pop" his ear adequately.  No recent ENT evaluation.   History of asthma Denies any SOB, coughing, wheezing, chest tightness, nocturnal awakenings, ER/urgent care visits or prednisone use since the last visit.  Assessment and Plan: Matthew Jensen is a 34 y.o. male with: Seasonal and perennial allergic rhinitis Past history - Perennial rhinitis symptoms for the past 20+ years with worsening in the spring.  2018 skin testing was positive to trees, grass, weed, ragweed, mold, dust mites, cat, dog and cockroach.  Started on allergy injections in October 2018 and has been doing well on it with no reactions.  On monthly injections for the past 6 months (T,G,W + DM,M,C,D,Cr) and restarted in our office on 01/23/2019. Interim history - still having nasal congestion and sometimes  makes it difficult to scuba dive. No recent ENT evaluation or montelukast use. Only using Flonase 1 spray per nostril once a day.   Continue allergy injections for now - will re-assess at the next visit.  Try to use Flonase 1 spray per nostril twice a day for nasal congestion.  Nasal saline spray (i.e., Simply Saline) or nasal saline lavage (i.e., NeilMed) is recommended as needed and prior to medicated nasal sprays. Start Singulair (montelukast) 10mg  daily at night. Cautioned that in some children/adults can experience behavioral changes including hyperactivity, agitation, depression, sleep disturbances and suicidal ideations. These side effects are rare, but if you notice them you should notify me and discontinue Singulair (montelukast).  Continue environmental control measures.  May use over the counter antihistamines such as Zyrtec (cetirizine), Claritin (loratadine), Allegra (fexofenadine), or Xyzal (levocetirizine) daily as needed.  Refer to ENT for nasal congestion.  History of asthma Past history -  asthma as a child which has been dormant until diagnosed with pneumonia a few years ago and required albuterol during that time. 2020 spirometry was normal. Interim history - asymptomatic.   May use albuterol rescue inhaler 2 puffs every 4 to 6 hours as needed for shortness of breath, chest tightness, coughing, and wheezing. May use albuterol rescue inhaler 2 puffs 5 to 15 minutes prior to strenuous physical activities. Monitor frequency of use.   Return in about 6 months (around 09/27/2020).  Meds ordered this encounter  Medications  . montelukast (SINGULAIR) 10 MG tablet    Sig: Take 1 tablet (10 mg total) by mouth at bedtime.    Dispense:  30 tablet    Refill:  5   Diagnostics: None.   Medication List:  Current Outpatient Medications  Medication Sig Dispense Refill  . fluticasone (FLONASE) 50 MCG/ACT nasal spray Place 1 spray into both nostrils daily.    . naproxen sodium  (ALEVE) 220 MG tablet Take 220 mg by mouth daily as needed.    Marland Kitchen guaiFENesin (MUCINEX) 600 MG 12 hr tablet Take 600 mg by mouth 2 (two) times daily. (Patient not taking: Reported on 03/29/2020)    . montelukast (SINGULAIR) 10 MG tablet Take 1 tablet (10 mg total) by mouth at bedtime. 30 tablet 5   No current facility-administered medications for this visit.   Allergies: No Known Allergies I reviewed his past medical history, social history, family history, and environmental history and no significant changes have been reported from his previous visit.  Review of Systems  Constitutional: Negative for appetite change, chills, fever and unexpected weight change.  HENT: Positive for congestion. Negative for rhinorrhea.   Eyes: Negative for itching.  Respiratory: Negative for cough, chest tightness, shortness of breath and wheezing.   Cardiovascular: Negative for chest pain.  Gastrointestinal: Negative for abdominal pain.  Genitourinary: Negative for difficulty urinating.  Skin: Negative for rash.  Allergic/Immunologic: Positive for environmental allergies. Negative for food allergies.  Neurological: Positive for headaches.   Objective: BP 100/60   Pulse (!) 52   Temp 98.1 F (36.7 C) (Temporal)   Resp 16   Wt 150 lb (68 kg)   SpO2 100%   BMI 22.15 kg/m  Body mass index is 22.15 kg/m. Physical Exam Vitals and nursing note reviewed.  Constitutional:      Appearance: Normal appearance. He is well-developed.  HENT:     Head: Normocephalic and atraumatic.     Right Ear: External ear normal.     Left Ear: External ear normal.     Nose: Nose normal.     Mouth/Throat:     Mouth: Mucous membranes are moist.     Pharynx: Oropharynx is clear.  Eyes:     Conjunctiva/sclera: Conjunctivae normal.  Cardiovascular:     Rate and Rhythm: Normal rate and regular rhythm.     Heart sounds: Normal heart sounds. No murmur heard.  No friction rub. No gallop.   Pulmonary:     Effort: Pulmonary  effort is normal.     Breath sounds: Normal breath sounds. No wheezing, rhonchi or rales.  Musculoskeletal:     Cervical back: Neck supple.  Skin:    General: Skin is warm.     Findings: No rash.  Neurological:     Mental Status: He is alert and oriented to person, place, and time.  Psychiatric:        Behavior: Behavior normal.    Previous notes and tests were reviewed. The plan was reviewed with the patient/family, and all questions/concerned were addressed.  It was my pleasure to see Matthew Jensen today and participate in his care. Please feel free to contact me with any questions or concerns.  Sincerely,  Wyline Mood, DO Allergy & Immunology  [facility] office: [phone] [facility] office: [phone] [place] office: [phone]

## 2020-03-29 NOTE — Patient Instructions (Addendum)
Environmental allergies:  Continue allergy injections for now - will re-assess at the next visit.  Try to use Flonase 1 spray per nostril twice a day for nasal congestion.  Nasal saline spray (i.e., Simply Saline) or nasal saline lavage (i.e., NeilMed) is recommended as needed and prior to medicated nasal sprays. Start Singulair (montelukast) 10mg  daily at night. Cautioned that in some children/adults can experience behavioral changes including hyperactivity, agitation, depression, sleep disturbances and suicidal ideations. These side effects are rare, but if you notice them you should notify me and discontinue Singulair (montelukast).  Continue environmental control measures.  May use over the counter antihistamines such as Zyrtec (cetirizine), Claritin (loratadine), Allegra (fexofenadine), or Xyzal (levocetirizine) daily as needed.  Refer to ENT for nasal congestion.   Breathing:   May use albuterol rescue inhaler 2 puffs every 4 to 6 hours as needed for shortness of breath, chest tightness, coughing, and wheezing. May use albuterol rescue inhaler 2 puffs 5 to 15 minutes prior to strenuous physical activities. Monitor frequency of use.  Follow up in 6 months - sooner if needed.  Reducing Pollen Exposure . Pollen seasons: trees (spring), grass (summer) and ragweed/weeds (fall). 12-07-1978 Keep windows closed in your home and car to lower pollen exposure.  Marland Kitchen air conditioning in the bedroom and throughout the house if possible.  . Avoid going out in dry windy days - especially early morning. . Pollen counts are highest between 5 - 10 AM and on dry, hot and windy days.  . Save outside activities for late afternoon or after a heavy rain, when pollen levels are lower.  . Avoid mowing of grass if you have grass pollen allergy. Lilian Kapur Be aware that pollen can also be transported indoors on people and pets.  . Dry your clothes in an automatic dryer rather than hanging them outside where they might  collect pollen.  . Rinse hair and eyes before bedtime.  Control of House Dust Mite Allergen . Dust mite allergens are a common trigger of allergy and asthma symptoms. While they can be found throughout the house, these microscopic creatures thrive in warm, humid environments such as bedding, upholstered furniture and carpeting. . Because so much time is spent in the bedroom, it is essential to reduce mite levels there.  . Encase pillows, mattresses, and box springs in special allergen-proof fabric covers or airtight, zippered plastic covers.  . Bedding should be washed weekly in hot water (130 F) and dried in a hot dryer. Allergen-proof covers are available for comforters and pillows that can't be regularly washed.  Marland Kitchen the allergy-proof covers every few months. Minimize clutter in the bedroom. Keep pets out of the bedroom.  Reyes Ivan Keep humidity less than 50% by using a dehumidifier or air conditioning. You can buy a humidity measuring device called a hygrometer to monitor this.  . If possible, replace carpets with hardwood, linoleum, or washable area rugs. If that's not possible, vacuum frequently with a vacuum that has a HEPA filter. . Remove all upholstered furniture and non-washable window drapes from the bedroom. . Remove all non-washable stuffed toys from the bedroom.  Wash stuffed toys weekly. Mold Control . Mold and fungi can grow on a variety of surfaces provided certain temperature and moisture conditions exist.  . Outdoor molds grow on plants, decaying vegetation and soil. The major outdoor mold, Alternaria and Cladosporium, are found in very high numbers during hot and dry conditions. Generally, a late summer - fall peak is seen for common  outdoor fungal spores. Rain will temporarily lower outdoor mold spore count, but counts rise rapidly when the rainy period ends. . The most important indoor molds are Aspergillus and Penicillium. Dark, humid and poorly ventilated basements are ideal  sites for mold growth. The next most common sites of mold growth are the bathroom and the kitchen. Outdoor (Seasonal) Mold Control . Use air conditioning and keep windows closed. . Avoid exposure to decaying vegetation. Marland Kitchen Avoid leaf raking. . Avoid grain handling. . Consider wearing a face mask if working in moldy areas.  Indoor (Perennial) Mold Control  . Maintain humidity below 50%. . Get rid of mold growth on hard surfaces with water, detergent and, if necessary, 5% bleach (do not mix with other cleaners). Then dry the area completely. If mold covers an area more than 10 square feet, consider hiring an indoor environmental professional. . For clothing, washing with soap and water is best. If moldy items cannot be cleaned and dried, throw them away. . Remove sources e.g. contaminated carpets. . Repair and seal leaking roofs or pipes. Using dehumidifiers in damp basements may be helpful, but empty the water and clean units regularly to prevent mildew from forming. All rooms, especially basements, bathrooms and kitchens, require ventilation and cleaning to deter mold and mildew growth. Avoid carpeting on concrete or damp floors, and storing items in damp areas. Cockroach Allergen Avoidance Cockroaches are often found in the homes of densely populated urban areas, schools or commercial buildings, but these creatures can lurk almost anywhere. This does not mean that you have a dirty house or living area. . Block all areas where roaches can enter the home. This includes crevices, wall cracks and windows.  . Cockroaches need water to survive, so fix and seal all leaky faucets and pipes. Have an exterminator go through the house when your family and pets are gone to eliminate any remaining roaches. Marland Kitchen Keep food in lidded containers and put pet food dishes away after your pets are done eating. Vacuum and sweep the floor after meals, and take out garbage and recyclables. Use lidded garbage containers in the  kitchen. Wash dishes immediately after use and clean under stoves, refrigerators or toasters where crumbs can accumulate. Wipe off the stove and other kitchen surfaces and cupboards regularly. Pet Allergen Avoidance: . Contrary to popular opinion, there are no "hypoallergenic" breeds of dogs or cats. That is because people are not allergic to an animal's hair, but to an allergen found in the animal's saliva, dander (dead skin flakes) or urine. Pet allergy symptoms typically occur within minutes. For some people, symptoms can build up and become most severe 8 to 12 hours after contact with the animal. People with severe allergies can experience reactions in public places if dander has been transported on the pet owners' clothing. Marland Kitchen Keeping an animal outdoors is only a partial solution, since homes with pets in the yard still have higher concentrations of animal allergens. . Before getting a pet, ask your allergist to determine if you are allergic to animals. If your pet is already considered part of your family, try to minimize contact and keep the pet out of the bedroom and other rooms where you spend a great deal of time. . As with dust mites, vacuum carpets often or replace carpet with a hardwood floor, tile or linoleum. . High-efficiency particulate air (HEPA) cleaners can reduce allergen levels over time. . While dander and saliva are the source of cat and dog allergens, urine is  the source of allergens from rabbits, hamsters, mice and [place] pigs; so ask a non-allergic family member to clean the animal's cage. . If you have a pet allergy, talk to your allergist about the potential for allergy immunotherapy (allergy shots). This strategy can often provide long-term relief.

## 2020-04-08 ENCOUNTER — Ambulatory Visit (INDEPENDENT_AMBULATORY_CARE_PROVIDER_SITE_OTHER): Payer: Managed Care, Other (non HMO)

## 2020-04-08 DIAGNOSIS — J309 Allergic rhinitis, unspecified: Secondary | ICD-10-CM

## 2020-05-06 ENCOUNTER — Ambulatory Visit (INDEPENDENT_AMBULATORY_CARE_PROVIDER_SITE_OTHER): Payer: Managed Care, Other (non HMO)

## 2020-05-06 DIAGNOSIS — J309 Allergic rhinitis, unspecified: Secondary | ICD-10-CM | POA: Diagnosis not present

## 2020-05-18 ENCOUNTER — Ambulatory Visit (INDEPENDENT_AMBULATORY_CARE_PROVIDER_SITE_OTHER): Payer: Managed Care, Other (non HMO)

## 2020-05-18 DIAGNOSIS — J309 Allergic rhinitis, unspecified: Secondary | ICD-10-CM | POA: Diagnosis not present

## 2020-05-25 ENCOUNTER — Ambulatory Visit (INDEPENDENT_AMBULATORY_CARE_PROVIDER_SITE_OTHER): Payer: Managed Care, Other (non HMO)

## 2020-05-25 DIAGNOSIS — J309 Allergic rhinitis, unspecified: Secondary | ICD-10-CM | POA: Diagnosis not present

## 2020-06-01 ENCOUNTER — Ambulatory Visit (INDEPENDENT_AMBULATORY_CARE_PROVIDER_SITE_OTHER): Payer: Managed Care, Other (non HMO)

## 2020-06-01 DIAGNOSIS — J309 Allergic rhinitis, unspecified: Secondary | ICD-10-CM | POA: Diagnosis not present

## 2020-06-10 ENCOUNTER — Ambulatory Visit (INDEPENDENT_AMBULATORY_CARE_PROVIDER_SITE_OTHER): Payer: Managed Care, Other (non HMO)

## 2020-06-10 DIAGNOSIS — J309 Allergic rhinitis, unspecified: Secondary | ICD-10-CM

## 2020-07-12 ENCOUNTER — Ambulatory Visit (INDEPENDENT_AMBULATORY_CARE_PROVIDER_SITE_OTHER): Payer: Managed Care, Other (non HMO) | Admitting: *Deleted

## 2020-07-12 DIAGNOSIS — J309 Allergic rhinitis, unspecified: Secondary | ICD-10-CM | POA: Diagnosis not present

## 2020-08-10 ENCOUNTER — Ambulatory Visit (INDEPENDENT_AMBULATORY_CARE_PROVIDER_SITE_OTHER): Payer: Managed Care, Other (non HMO)

## 2020-08-10 DIAGNOSIS — J309 Allergic rhinitis, unspecified: Secondary | ICD-10-CM

## 2020-09-09 ENCOUNTER — Ambulatory Visit (INDEPENDENT_AMBULATORY_CARE_PROVIDER_SITE_OTHER): Payer: Managed Care, Other (non HMO)

## 2020-09-09 DIAGNOSIS — J309 Allergic rhinitis, unspecified: Secondary | ICD-10-CM

## 2020-09-27 ENCOUNTER — Ambulatory Visit: Payer: Managed Care, Other (non HMO) | Admitting: Allergy

## 2020-09-27 NOTE — Progress Notes (Deleted)
Follow Up Note  RE: Matthew Jensen MRN: [id number] DOB: 08-08-1985 Date of Office Visit: 09/27/2020  Referring provider: No ref. provider found Primary care provider: Patient, No Pcp Per  Chief Complaint: No chief complaint on file.  History of Present Illness: I had the pleasure of seeing Matthew Jensen for a follow up visit at the [facility] on 09/27/2020. He is a 35 y.o. male, who is being followed for allergic rhinitis on AIT and history of asthma. His previous allergy office visit was on 03/29/2020 with Dr. Selena Batten. Today is a regular follow up visit.  Seasonal and perennial allergic rhinitis Past history - Perennial rhinitis symptoms for the past 20+ years with worsening in the spring.  2018 skin testing was positive to trees, grass, weed, ragweed, mold, dust mites, cat, dog and cockroach.  Started on allergy injections in October 2018 and has been doing well on it with no reactions.  On monthly injections for the past 6 months (T,G,W + DM,M,C,D,Cr) and restarted in our office on 01/23/2019. Interim history - still having nasal congestion and sometimes makes it difficult to scuba dive. No recent ENT evaluation or montelukast use. Only using Flonase 1 spray per nostril once a day.   Continue allergy injections for now - will re-assess at the next visit.  Try to use Flonase 1 spray per nostril twice a day for nasal congestion.  Nasal saline spray (i.e., Simply Saline) or nasal saline lavage (i.e., NeilMed) is recommended as needed and prior to medicated nasal sprays.  Start Singulair (montelukast) 10mg  daily at night.  Cautioned that in some children/adults can experience behavioral changes including hyperactivity, agitation, depression, sleep disturbances and suicidal ideations. These side effects are rare, but if you notice them you should notify me and discontinue Singulair (montelukast).  Continue environmental control measures.  May use over the counter  antihistamines such as Zyrtec (cetirizine), Claritin (loratadine), Allegra (fexofenadine), or Xyzal (levocetirizine) daily as needed.  Refer to ENT for nasal congestion.  History of asthma Past history -  asthma as a child which has been dormant until diagnosed with pneumonia a few years ago and required albuterol during that time. 2020 spirometry was normal. Interim history - asymptomatic.   May use albuterol rescue inhaler 2 puffs every 4 to 6 hours as needed for shortness of breath, chest tightness, coughing, and wheezing. May use albuterol rescue inhaler 2 puffs 5 to 15 minutes prior to strenuous physical activities. Monitor frequency of use.   Return in about 6 months (around 09/27/2020).  Assessment and Plan: Matthew Jensen is a 35 y.o. male with: No problem-specific Assessment & Plan notes found for this encounter.  No follow-ups on file.  No orders of the defined types were placed in this encounter.  Lab Orders  No laboratory test(s) ordered today    Diagnostics: Spirometry:  Tracings reviewed. His effort: {Blank single:19197::"Good reproducible efforts.","It was hard to get consistent efforts and there is a question as to whether this reflects a maximal maneuver.","Poor effort, data can not be interpreted."} FVC: ***L FEV1: ***L, ***% predicted FEV1/FVC ratio: ***% Interpretation: {Blank single:19197::"Spirometry consistent with mild obstructive disease","Spirometry consistent with moderate obstructive disease","Spirometry consistent with severe obstructive disease","Spirometry consistent with possible restrictive disease","Spirometry consistent with mixed obstructive and restrictive disease","Spirometry uninterpretable due to technique","Spirometry consistent with normal pattern","No overt abnormalities noted given today's efforts"}.  Please see scanned spirometry results for details.  Skin Testing: {Blank single:19197::"Select foods","Environmental allergy panel","Environmental  allergy panel and select foods","Food allergy panel","None","Deferred due  to recent antihistamines use"}. Positive test to: ***. Negative test to: ***.  Results discussed with patient/family.   Medication List:  Current Outpatient Medications  Medication Sig Dispense Refill  . fluticasone (FLONASE) 50 MCG/ACT nasal spray Place 1 spray into both nostrils daily.    Marland Kitchen guaiFENesin (MUCINEX) 600 MG 12 hr tablet Take 600 mg by mouth 2 (two) times daily. (Patient not taking: Reported on 03/29/2020)    . montelukast (SINGULAIR) 10 MG tablet Take 1 tablet (10 mg total) by mouth at bedtime. 30 tablet 5  . naproxen sodium (ALEVE) 220 MG tablet Take 220 mg by mouth daily as needed.     No current facility-administered medications for this visit.   Allergies: No Known Allergies I reviewed his past medical history, social history, family history, and environmental history and no significant changes have been reported from his previous visit.  Review of Systems  Constitutional: Negative for appetite change, chills, fever and unexpected weight change.  HENT: Positive for congestion. Negative for rhinorrhea.   Eyes: Negative for itching.  Respiratory: Negative for cough, chest tightness, shortness of breath and wheezing.   Cardiovascular: Negative for chest pain.  Gastrointestinal: Negative for abdominal pain.  Genitourinary: Negative for difficulty urinating.  Skin: Negative for rash.  Allergic/Immunologic: Positive for environmental allergies. Negative for food allergies.  Neurological: Positive for headaches.   Objective: There were no vitals taken for this visit. There is no height or weight on file to calculate BMI. Physical Exam Vitals and nursing note reviewed.  Constitutional:      Appearance: Normal appearance. He is well-developed.  HENT:     Head: Normocephalic and atraumatic.     Right Ear: External ear normal.     Left Ear: External ear normal.     Nose: Nose normal.      Mouth/Throat:     Mouth: Mucous membranes are moist.     Pharynx: Oropharynx is clear.  Eyes:     Conjunctiva/sclera: Conjunctivae normal.  Cardiovascular:     Rate and Rhythm: Normal rate and regular rhythm.     Heart sounds: Normal heart sounds. No murmur heard. No friction rub. No gallop.   Pulmonary:     Effort: Pulmonary effort is normal.     Breath sounds: Normal breath sounds. No wheezing, rhonchi or rales.  Musculoskeletal:     Cervical back: Neck supple.  Skin:    General: Skin is warm.     Findings: No rash.  Neurological:     Mental Status: He is alert and oriented to person, place, and time.  Psychiatric:        Behavior: Behavior normal.    Previous notes and tests were reviewed. The plan was reviewed with the patient/family, and all questions/concerned were addressed.  It was my pleasure to see Matthew Jensen today and participate in his care. Please feel free to contact me with any questions or concerns.  Sincerely,  Wyline Mood, DO Allergy & Immunology  [facility] office: [phone] [facility] office: [phone]

## 2020-10-11 ENCOUNTER — Ambulatory Visit: Payer: Managed Care, Other (non HMO) | Admitting: Allergy

## 2020-10-13 ENCOUNTER — Ambulatory Visit (INDEPENDENT_AMBULATORY_CARE_PROVIDER_SITE_OTHER): Payer: Managed Care, Other (non HMO)

## 2020-10-13 DIAGNOSIS — J309 Allergic rhinitis, unspecified: Secondary | ICD-10-CM

## 2020-10-20 DIAGNOSIS — J3089 Other allergic rhinitis: Secondary | ICD-10-CM | POA: Diagnosis not present

## 2020-10-20 NOTE — Progress Notes (Signed)
Vials exp 10-20-21 

## 2020-10-27 ENCOUNTER — Ambulatory Visit: Payer: Managed Care, Other (non HMO) | Admitting: Allergy

## 2020-11-04 ENCOUNTER — Ambulatory Visit (INDEPENDENT_AMBULATORY_CARE_PROVIDER_SITE_OTHER): Payer: Managed Care, Other (non HMO) | Admitting: *Deleted

## 2020-11-04 DIAGNOSIS — J309 Allergic rhinitis, unspecified: Secondary | ICD-10-CM

## 2020-11-22 ENCOUNTER — Ambulatory Visit (INDEPENDENT_AMBULATORY_CARE_PROVIDER_SITE_OTHER): Payer: Managed Care, Other (non HMO) | Admitting: Allergy

## 2020-11-22 ENCOUNTER — Other Ambulatory Visit: Payer: Self-pay

## 2020-11-22 ENCOUNTER — Encounter: Payer: Self-pay | Admitting: Allergy

## 2020-11-22 ENCOUNTER — Telehealth: Payer: Self-pay

## 2020-11-22 VITALS — BP 126/60 | HR 80 | Temp 97.6°F | Resp 14 | Ht 69.0 in | Wt 153.2 lb

## 2020-11-22 DIAGNOSIS — J302 Other seasonal allergic rhinitis: Secondary | ICD-10-CM | POA: Diagnosis not present

## 2020-11-22 DIAGNOSIS — J3089 Other allergic rhinitis: Secondary | ICD-10-CM | POA: Diagnosis not present

## 2020-11-22 DIAGNOSIS — Z8709 Personal history of other diseases of the respiratory system: Secondary | ICD-10-CM

## 2020-11-22 NOTE — Patient Instructions (Addendum)
Environmental allergies:  Continue allergy injections for now - will re-assess at the next visit.  Continue Flonase 1 spray per nostril twice a day for nasal congestion.  Nasal saline spray (i.e., Simply Saline) or nasal saline lavage (i.e., NeilMed) is recommended as needed and prior to medicated nasal sprays.  Continue environmental control measures.  May use over the counter antihistamines such as Zyrtec (cetirizine), Claritin (loratadine), Allegra (fexofenadine), or Xyzal (levocetirizine) daily as needed.  Refer to ENT for nasal congestion [facility] or Dr. Suszanne Conners.   Breathing:   May use albuterol rescue inhaler 2 puffs every 4 to 6 hours as needed for shortness of breath, chest tightness, coughing, and wheezing. Monitor frequency of use.  Follow up in 6 months - sooner if needed.  Reducing Pollen Exposure . Pollen seasons: trees (spring), grass (summer) and ragweed/weeds (fall). Marland Kitchen Keep windows closed in your home and car to lower pollen exposure.  Lilian Kapur air conditioning in the bedroom and throughout the house if possible.  . Avoid going out in dry windy days - especially early morning. . Pollen counts are highest between 5 - 10 AM and on dry, hot and windy days.  . Save outside activities for late afternoon or after a heavy rain, when pollen levels are lower.  . Avoid mowing of grass if you have grass pollen allergy. Marland Kitchen Be aware that pollen can also be transported indoors on people and pets.  . Dry your clothes in an automatic dryer rather than hanging them outside where they might collect pollen.  . Rinse hair and eyes before bedtime.  Control of House Dust Mite Allergen . Dust mite allergens are a common trigger of allergy and asthma symptoms. While they can be found throughout the house, these microscopic creatures thrive in warm, humid environments such as bedding, upholstered furniture and carpeting. . Because so much time is spent in the bedroom, it is essential to  reduce mite levels there.  . Encase pillows, mattresses, and box springs in special allergen-proof fabric covers or airtight, zippered plastic covers.  . Bedding should be washed weekly in hot water (130 F) and dried in a hot dryer. Allergen-proof covers are available for comforters and pillows that can't be regularly washed.  Reyes Ivan the allergy-proof covers every few months. Minimize clutter in the bedroom. Keep pets out of the bedroom.  Marland Kitchen Keep humidity less than 50% by using a dehumidifier or air conditioning. You can buy a humidity measuring device called a hygrometer to monitor this.  . If possible, replace carpets with hardwood, linoleum, or washable area rugs. If that's not possible, vacuum frequently with a vacuum that has a HEPA filter. . Remove all upholstered furniture and non-washable window drapes from the bedroom. . Remove all non-washable stuffed toys from the bedroom.  Wash stuffed toys weekly. Mold Control . Mold and fungi can grow on a variety of surfaces provided certain temperature and moisture conditions exist.  . Outdoor molds grow on plants, decaying vegetation and soil. The major outdoor mold, Alternaria and Cladosporium, are found in very high numbers during hot and dry conditions. Generally, a late summer - fall peak is seen for common outdoor fungal spores. Rain will temporarily lower outdoor mold spore count, but counts rise rapidly when the rainy period ends. . The most important indoor molds are Aspergillus and Penicillium. Dark, humid and poorly ventilated basements are ideal sites for mold growth. The next most common sites of mold growth are the bathroom and the  kitchen. Outdoor (Seasonal) Mold Control . Use air conditioning and keep windows closed. . Avoid exposure to decaying vegetation. Marland Kitchen Avoid leaf raking. . Avoid grain handling. . Consider wearing a face mask if working in moldy areas.  Indoor (Perennial) Mold Control  . Maintain humidity below 50%. . Get rid  of mold growth on hard surfaces with water, detergent and, if necessary, 5% bleach (do not mix with other cleaners). Then dry the area completely. If mold covers an area more than 10 square feet, consider hiring an indoor environmental professional. . For clothing, washing with soap and water is best. If moldy items cannot be cleaned and dried, throw them away. . Remove sources e.g. contaminated carpets. . Repair and seal leaking roofs or pipes. Using dehumidifiers in damp basements may be helpful, but empty the water and clean units regularly to prevent mildew from forming. All rooms, especially basements, bathrooms and kitchens, require ventilation and cleaning to deter mold and mildew growth. Avoid carpeting on concrete or damp floors, and storing items in damp areas. Cockroach Allergen Avoidance Cockroaches are often found in the homes of densely populated urban areas, schools or commercial buildings, but these creatures can lurk almost anywhere. This does not mean that you have a dirty house or living area. . Block all areas where roaches can enter the home. This includes crevices, wall cracks and windows.  . Cockroaches need water to survive, so fix and seal all leaky faucets and pipes. Have an exterminator go through the house when your family and pets are gone to eliminate any remaining roaches. Marland Kitchen Keep food in lidded containers and put pet food dishes away after your pets are done eating. Vacuum and sweep the floor after meals, and take out garbage and recyclables. Use lidded garbage containers in the kitchen. Wash dishes immediately after use and clean under stoves, refrigerators or toasters where crumbs can accumulate. Wipe off the stove and other kitchen surfaces and cupboards regularly. Pet Allergen Avoidance: . Contrary to popular opinion, there are no "hypoallergenic" breeds of dogs or cats. That is because people are not allergic to an animal's hair, but to an allergen found in the animal's  saliva, dander (dead skin flakes) or urine. Pet allergy symptoms typically occur within minutes. For some people, symptoms can build up and become most severe 8 to 12 hours after contact with the animal. People with severe allergies can experience reactions in public places if dander has been transported on the pet owners' clothing. Marland Kitchen Keeping an animal outdoors is only a partial solution, since homes with pets in the yard still have higher concentrations of animal allergens. . Before getting a pet, ask your allergist to determine if you are allergic to animals. If your pet is already considered part of your family, try to minimize contact and keep the pet out of the bedroom and other rooms where you spend a great deal of time. . As with dust mites, vacuum carpets often or replace carpet with a hardwood floor, tile or linoleum. . High-efficiency particulate air (HEPA) cleaners can reduce allergen levels over time. . While dander and saliva are the source of cat and dog allergens, urine is the source of allergens from rabbits, hamsters, mice and [place] pigs; so ask a non-allergic family member to clean the animal's cage. . If you have a pet allergy, talk to your allergist about the potential for allergy immunotherapy (allergy shots). This strategy can often provide long-term relief.

## 2020-11-22 NOTE — Progress Notes (Signed)
Follow Up Note  RE: Matthew Jensen MRN: [id number] DOB: 1985-11-27 Date of Office Visit: 11/22/2020  Referring provider: No ref. provider found Primary care provider: Patient, No Pcp Per (Inactive)  Chief Complaint: seasonal and perennial allergic rhinitis  History of Present Illness: I had the pleasure of seeing Matthew Jensen for a follow up visit at the [facility] on 11/22/2020. He is a 35 y.o. male, who is being followed for allergic rhinitis on AIT and history of asthma. His previous allergy office visit was on 03/29/2020 with Dr. Selena Batten. Today is a regular follow up visit.  Seasonal and perennial allergic rhinitis Currently on allergy injections and not sure if it's been helping. He has perennial nasal congestion which gets worse in dusty areas and after cat grooming.   Using saline spray as needed. Using Flonase 1 spray per nostril 1-2 times per day if needed - minimal nosebleeds.  Singulair did not help so he stopped after a few weeks.  Not taking any antihistamines.   Patient did not see Jensen yet.  History of asthma Denies any SOB, coughing, wheezing, chest tightness, nocturnal awakenings, ER/urgent care visits or prednisone use since the last visit.  Assessment and Plan: Matthew Jensen is a 35 y.o. male with: Seasonal and perennial allergic rhinitis Past history - Perennial rhinitis symptoms for the past 20+ years with worsening in the spring.  2018 skin testing was positive to trees, grass, weed, ragweed, mold, dust mites, cat, dog and cockroach.  Started on allergy injections in October 2018 and has been doing well on it with no reactions.  On monthly injections for the past 6 months (T,G,W + DM,M,C,D,Cr) and restarted in our office on 01/23/2019. Interim history - no improvement in nasal congestion. Did not see Jensen yet.  Continue allergy injections for now - will re-assess at the next visit (may need to re-test before stopping).  Continue Flonase 1 spray  per nostril twice a day for nasal congestion.  Nasal saline spray (i.e., Simply Saline) or nasal saline lavage (i.e., NeilMed) is recommended as needed and prior to medicated nasal sprays.  Continue environmental control measures.  May use over the counter antihistamines such as Zyrtec (cetirizine), Claritin (loratadine), Allegra (fexofenadine), or Xyzal (levocetirizine) daily as needed.  Refer to Jensen for nasal congestion Matthew Jensen, Matthew Jensen or Dr. Suszanne Conners.   History of asthma Past history -  asthma as a child which has been dormant until diagnosed with pneumonia a few years ago and required albuterol during that time. 2020 spirometry was normal.  May use albuterol rescue inhaler 2 puffs every 4 to 6 hours as needed for shortness of breath, chest tightness, coughing, and wheezing. Monitor frequency of use.  Return in about 6 months (around 05/24/2021).  No orders of the defined types were placed in this encounter.  Lab Orders  No laboratory test(s) ordered today    Diagnostics: None.    Medication List:  Current Outpatient Medications  Medication Sig Dispense Refill  . fluticasone (FLONASE) 50 MCG/ACT nasal spray Place 1 spray into both nostrils daily.     No current facility-administered medications for this visit.   Allergies: No Known Allergies I reviewed his past medical history, social history, family history, and environmental history and no significant changes have been reported from his previous visit.  Review of Systems  Constitutional: Negative for appetite change, chills, fever and unexpected weight change.  HENT: Positive for congestion. Negative for rhinorrhea.   Eyes: Negative for itching.  Respiratory: Negative for cough, chest tightness, shortness of breath and wheezing.   Cardiovascular: Negative for chest pain.  Gastrointestinal: Negative for abdominal pain.  Genitourinary: Negative for difficulty urinating.  Skin: Negative for rash.  Allergic/Immunologic:  Positive for environmental allergies. Negative for food allergies.   Objective: BP 126/60 (BP Location: Left Arm, Patient Position: Sitting, Cuff Size: Normal)   Pulse 80   Temp 97.6 F (36.4 C) (Temporal)   Resp 14   Ht 5\' 9"  (1.753 m)   Wt 153 lb 3.2 oz (69.5 kg)   SpO2 100%   BMI 22.62 kg/m  Body mass index is 22.62 kg/m. Physical Exam Vitals and nursing note reviewed.  Constitutional:      Appearance: Normal appearance. He is well-developed.  HENT:     Head: Normocephalic and atraumatic.     Right Ear: Tympanic membrane and external ear normal.     Left Ear: Tympanic membrane and external ear normal.     Nose: Nose normal.     Mouth/Throat:     Mouth: Mucous membranes are moist.     Pharynx: Oropharynx is clear.  Eyes:     Conjunctiva/sclera: Conjunctivae normal.  Cardiovascular:     Rate and Rhythm: Normal rate and regular rhythm.     Heart sounds: Normal heart sounds. No murmur heard. No friction rub. No gallop.   Pulmonary:     Effort: Pulmonary effort is normal.     Breath sounds: Normal breath sounds. No wheezing, rhonchi or rales.  Musculoskeletal:     Cervical back: Neck supple.  Skin:    General: Skin is warm.     Findings: No rash.  Neurological:     Mental Status: He is alert and oriented to person, place, and time.  Psychiatric:        Behavior: Behavior normal.    Previous notes and tests were reviewed. The plan was reviewed with the patient/family, and all questions/concerned were addressed.  It was my pleasure to see [name] today and participate in his care. Please feel free to contact me with any questions or concerns.  Sincerely,  Casimiro Needle, DO Allergy & Immunology  [facility] office: [phone] [facility] office: [phone]

## 2020-11-22 NOTE — Assessment & Plan Note (Signed)
Past history -  asthma as a child which has been dormant until diagnosed with pneumonia a few years ago and required albuterol during that time. 2020 spirometry was normal.  May use albuterol rescue inhaler 2 puffs every 4 to 6 hours as needed for shortness of breath, chest tightness, coughing, and wheezing. Monitor frequency of use.

## 2020-11-22 NOTE — Telephone Encounter (Signed)
-----   Message from Ellamae Sia, DO sent at 11/22/2020 12:58 PM EDT ----- Please place Ent referral for nasal congestion (Dr. Suszanne Conners or [facility])

## 2020-11-22 NOTE — Assessment & Plan Note (Signed)
Past history - Perennial rhinitis symptoms for the past 20+ years with worsening in the spring.  2018 skin testing was positive to trees, grass, weed, ragweed, mold, dust mites, cat, dog and cockroach.  Started on allergy injections in October 2018 and has been doing well on it with no reactions.  On monthly injections for the past 6 months (T,G,W + DM,M,C,D,Cr) and restarted in our office on 01/23/2019. Interim history - no improvement in nasal congestion. Did not see ENT yet.  Continue allergy injections for now - will re-assess at the next visit (may need to re-test before stopping).  Continue Flonase 1 spray per nostril twice a day for nasal congestion.  Nasal saline spray (i.e., Simply Saline) or nasal saline lavage (i.e., NeilMed) is recommended as needed and prior to medicated nasal sprays.  Continue environmental control measures.  May use over the counter antihistamines such as Zyrtec (cetirizine), Claritin (loratadine), Allegra (fexofenadine), or Xyzal (levocetirizine) daily as needed.  Refer to ENT for nasal congestion [facility] or Dr. Suszanne Conners.

## 2020-11-23 NOTE — Telephone Encounter (Signed)
Referral has been placed to Dr Avel Sensor office for review and scheduling.  I left a detailed voicemail for the patient regarding the referral being placed.    Matthew Philomena Doheny, MD, PA [phone] [place], [place] [address] [phone]

## 2020-11-30 ENCOUNTER — Ambulatory Visit (INDEPENDENT_AMBULATORY_CARE_PROVIDER_SITE_OTHER): Payer: Managed Care, Other (non HMO) | Admitting: *Deleted

## 2020-11-30 DIAGNOSIS — J309 Allergic rhinitis, unspecified: Secondary | ICD-10-CM | POA: Diagnosis not present

## 2020-12-30 ENCOUNTER — Ambulatory Visit (INDEPENDENT_AMBULATORY_CARE_PROVIDER_SITE_OTHER): Payer: Managed Care, Other (non HMO) | Admitting: *Deleted

## 2020-12-30 DIAGNOSIS — J309 Allergic rhinitis, unspecified: Secondary | ICD-10-CM | POA: Diagnosis not present

## 2021-01-11 ENCOUNTER — Ambulatory Visit (INDEPENDENT_AMBULATORY_CARE_PROVIDER_SITE_OTHER): Payer: Managed Care, Other (non HMO) | Admitting: *Deleted

## 2021-01-11 DIAGNOSIS — J309 Allergic rhinitis, unspecified: Secondary | ICD-10-CM

## 2021-01-17 ENCOUNTER — Encounter: Payer: Self-pay | Admitting: Allergy

## 2021-01-17 NOTE — Progress Notes (Signed)
Notes reviewed from Dr. Suszanne Conners (ENT) 12/29/2020. 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  Patient will do conservative treatment with Flonase.

## 2021-01-18 ENCOUNTER — Ambulatory Visit (INDEPENDENT_AMBULATORY_CARE_PROVIDER_SITE_OTHER): Payer: Managed Care, Other (non HMO) | Admitting: *Deleted

## 2021-01-18 DIAGNOSIS — J309 Allergic rhinitis, unspecified: Secondary | ICD-10-CM

## 2021-01-27 ENCOUNTER — Ambulatory Visit (INDEPENDENT_AMBULATORY_CARE_PROVIDER_SITE_OTHER): Payer: Managed Care, Other (non HMO) | Admitting: *Deleted

## 2021-01-27 DIAGNOSIS — J309 Allergic rhinitis, unspecified: Secondary | ICD-10-CM | POA: Diagnosis not present

## 2021-02-07 ENCOUNTER — Ambulatory Visit (INDEPENDENT_AMBULATORY_CARE_PROVIDER_SITE_OTHER): Payer: Managed Care, Other (non HMO)

## 2021-02-07 DIAGNOSIS — J309 Allergic rhinitis, unspecified: Secondary | ICD-10-CM

## 2021-03-10 NOTE — Telephone Encounter (Signed)
Patient seen on 12/29/2020 @2pm  with Dr. 

## 2021-03-11 ENCOUNTER — Ambulatory Visit (INDEPENDENT_AMBULATORY_CARE_PROVIDER_SITE_OTHER): Payer: Managed Care, Other (non HMO)

## 2021-03-11 DIAGNOSIS — J309 Allergic rhinitis, unspecified: Secondary | ICD-10-CM

## 2021-04-06 ENCOUNTER — Ambulatory Visit (INDEPENDENT_AMBULATORY_CARE_PROVIDER_SITE_OTHER): Payer: Managed Care, Other (non HMO)

## 2021-04-06 DIAGNOSIS — J309 Allergic rhinitis, unspecified: Secondary | ICD-10-CM

## 2021-05-05 ENCOUNTER — Ambulatory Visit (INDEPENDENT_AMBULATORY_CARE_PROVIDER_SITE_OTHER): Payer: Managed Care, Other (non HMO) | Admitting: *Deleted

## 2021-05-05 DIAGNOSIS — J309 Allergic rhinitis, unspecified: Secondary | ICD-10-CM

## 2021-05-25 ENCOUNTER — Ambulatory Visit: Payer: Managed Care, Other (non HMO) | Admitting: Allergy

## 2021-06-02 ENCOUNTER — Ambulatory Visit (INDEPENDENT_AMBULATORY_CARE_PROVIDER_SITE_OTHER): Payer: Managed Care, Other (non HMO) | Admitting: *Deleted

## 2021-06-02 DIAGNOSIS — J309 Allergic rhinitis, unspecified: Secondary | ICD-10-CM

## 2021-06-13 ENCOUNTER — Other Ambulatory Visit: Payer: Self-pay | Admitting: Otolaryngology

## 2021-06-27 ENCOUNTER — Encounter (HOSPITAL_BASED_OUTPATIENT_CLINIC_OR_DEPARTMENT_OTHER): Payer: Self-pay | Admitting: Otolaryngology

## 2021-06-27 ENCOUNTER — Other Ambulatory Visit: Payer: Self-pay

## 2021-07-01 ENCOUNTER — Ambulatory Visit (INDEPENDENT_AMBULATORY_CARE_PROVIDER_SITE_OTHER): Payer: Managed Care, Other (non HMO)

## 2021-07-01 DIAGNOSIS — J309 Allergic rhinitis, unspecified: Secondary | ICD-10-CM

## 2021-07-04 ENCOUNTER — Ambulatory Visit (HOSPITAL_BASED_OUTPATIENT_CLINIC_OR_DEPARTMENT_OTHER): Payer: Managed Care, Other (non HMO) | Admitting: Certified Registered"

## 2021-07-04 ENCOUNTER — Encounter (HOSPITAL_BASED_OUTPATIENT_CLINIC_OR_DEPARTMENT_OTHER): Payer: Self-pay | Admitting: Otolaryngology

## 2021-07-04 ENCOUNTER — Ambulatory Visit (HOSPITAL_BASED_OUTPATIENT_CLINIC_OR_DEPARTMENT_OTHER)
Admission: RE | Admit: 2021-07-04 | Discharge: 2021-07-04 | Disposition: A | Discharge status: Home / Self Care | Payer: Managed Care, Other (non HMO) | Source: Ambulatory Visit | Attending: Otolaryngology | Admitting: Otolaryngology

## 2021-07-04 ENCOUNTER — Other Ambulatory Visit: Payer: Self-pay

## 2021-07-04 ENCOUNTER — Encounter (HOSPITAL_BASED_OUTPATIENT_CLINIC_OR_DEPARTMENT_OTHER)
Admission: RE | Disposition: A | Discharge status: Home / Self Care | Payer: Self-pay | Source: Ambulatory Visit | Attending: Otolaryngology

## 2021-07-04 DIAGNOSIS — J45909 Unspecified asthma, uncomplicated: Secondary | ICD-10-CM | POA: Diagnosis not present

## 2021-07-04 DIAGNOSIS — J342 Deviated nasal septum: Secondary | ICD-10-CM | POA: Diagnosis present

## 2021-07-04 DIAGNOSIS — J3489 Other specified disorders of nose and nasal sinuses: Secondary | ICD-10-CM | POA: Diagnosis not present

## 2021-07-04 DIAGNOSIS — J343 Hypertrophy of nasal turbinates: Secondary | ICD-10-CM | POA: Insufficient documentation

## 2021-07-04 HISTORY — PX: NASAL SEPTOPLASTY W/ TURBINOPLASTY: SHX2070

## 2021-07-04 SURGERY — SEPTOPLASTY, NOSE, WITH NASAL TURBINATE REDUCTION
Anesthesia: General | Site: Nose | Laterality: Bilateral

## 2021-07-04 MED ORDER — FENTANYL CITRATE (PF) 100 MCG/2ML IJ SOLN
INTRAMUSCULAR | Status: AC
  Filled 2021-07-04: qty 2

## 2021-07-04 MED ORDER — MIDAZOLAM HCL 2 MG/2ML IJ SOLN
INTRAMUSCULAR | Status: AC
  Filled 2021-07-04: qty 2

## 2021-07-04 MED ORDER — CEFAZOLIN SODIUM 1 G IJ SOLR
INTRAMUSCULAR | Status: AC
  Filled 2021-07-04: qty 20

## 2021-07-04 MED ORDER — LIDOCAINE-EPINEPHRINE 1 %-1:100000 IJ SOLN
INTRAMUSCULAR | Status: DC | PRN
  Administered 2021-07-04: 3 mL

## 2021-07-04 MED ORDER — ACETAMINOPHEN 500 MG PO TABS
1000.0000 mg | ORAL_TABLET | Freq: Once | ORAL | Status: AC
  Administered 2021-07-04: 1000 mg via ORAL

## 2021-07-04 MED ORDER — AMOXICILLIN 875 MG PO TABS: 875.0000 mg | ORAL_TABLET | Freq: Two times a day (BID) | ORAL | 0 refills | Status: AC

## 2021-07-04 MED ORDER — LIDOCAINE 2% (20 MG/ML) 5 ML SYRINGE
INTRAMUSCULAR | Status: AC
  Filled 2021-07-04: qty 5

## 2021-07-04 MED ORDER — SUGAMMADEX SODIUM 500 MG/5ML IV SOLN
INTRAVENOUS | Status: AC
  Filled 2021-07-04: qty 5

## 2021-07-04 MED ORDER — EPHEDRINE SULFATE 50 MG/ML IJ SOLN
INTRAMUSCULAR | Status: DC | PRN
  Administered 2021-07-04: 10 mg via INTRAVENOUS

## 2021-07-04 MED ORDER — LIDOCAINE HCL (CARDIAC) PF 100 MG/5ML IV SOSY
PREFILLED_SYRINGE | INTRAVENOUS | Status: DC | PRN
  Administered 2021-07-04: 60 mg via INTRAVENOUS

## 2021-07-04 MED ORDER — SUGAMMADEX SODIUM 200 MG/2ML IV SOLN
INTRAVENOUS | Status: DC | PRN
  Administered 2021-07-04: 200 mg via INTRAVENOUS

## 2021-07-04 MED ORDER — ACETAMINOPHEN 500 MG PO TABS
ORAL_TABLET | ORAL | Status: AC
  Filled 2021-07-04: qty 2

## 2021-07-04 MED ORDER — ROCURONIUM BROMIDE 100 MG/10ML IV SOLN
INTRAVENOUS | Status: DC | PRN
  Administered 2021-07-04: 40 mg via INTRAVENOUS

## 2021-07-04 MED ORDER — DEXAMETHASONE SODIUM PHOSPHATE 10 MG/ML IJ SOLN
INTRAMUSCULAR | Status: AC
  Filled 2021-07-04: qty 1

## 2021-07-04 MED ORDER — MUPIROCIN 2 % EX OINT
TOPICAL_OINTMENT | CUTANEOUS | Status: DC | PRN
  Administered 2021-07-04: 1 via TOPICAL

## 2021-07-04 MED ORDER — ONDANSETRON HCL 4 MG/2ML IJ SOLN
INTRAMUSCULAR | Status: AC
  Filled 2021-07-04: qty 2

## 2021-07-04 MED ORDER — PROPOFOL 500 MG/50ML IV EMUL
INTRAVENOUS | Status: AC
  Filled 2021-07-04: qty 150

## 2021-07-04 MED ORDER — MIDAZOLAM HCL 5 MG/5ML IJ SOLN
INTRAMUSCULAR | Status: DC | PRN
  Administered 2021-07-04: 2 mg via INTRAVENOUS

## 2021-07-04 MED ORDER — OXYMETAZOLINE HCL 0.05 % NA SOLN
NASAL | Status: DC | PRN
  Administered 2021-07-04: 1 via TOPICAL

## 2021-07-04 MED ORDER — PROPOFOL 10 MG/ML IV BOLUS
INTRAVENOUS | Status: DC | PRN
  Administered 2021-07-04: 150 mg via INTRAVENOUS

## 2021-07-04 MED ORDER — OXYCODONE HCL 5 MG PO TABS
5.0000 mg | ORAL_TABLET | Freq: Once | ORAL | Status: AC | PRN
  Administered 2021-07-04: 5 mg via ORAL

## 2021-07-04 MED ORDER — DEXAMETHASONE SODIUM PHOSPHATE 4 MG/ML IJ SOLN
INTRAMUSCULAR | Status: DC | PRN
  Administered 2021-07-04: 10 mg via INTRAVENOUS

## 2021-07-04 MED ORDER — LACTATED RINGERS IV SOLN: INTRAVENOUS | Status: DC

## 2021-07-04 MED ORDER — OXYCODONE HCL 5 MG/5ML PO SOLN: 5.0000 mg | Freq: Once | ORAL | Status: AC | PRN

## 2021-07-04 MED ORDER — ONDANSETRON HCL 4 MG/2ML IJ SOLN
INTRAMUSCULAR | Status: DC | PRN
  Administered 2021-07-04: 4 mg via INTRAVENOUS

## 2021-07-04 MED ORDER — ROCURONIUM BROMIDE 10 MG/ML (PF) SYRINGE
PREFILLED_SYRINGE | INTRAVENOUS | Status: AC
  Filled 2021-07-04: qty 10

## 2021-07-04 MED ORDER — FENTANYL CITRATE (PF) 100 MCG/2ML IJ SOLN
25.0000 ug | INTRAMUSCULAR | Status: DC | PRN
  Administered 2021-07-04 (×3): 50 ug via INTRAVENOUS

## 2021-07-04 MED ORDER — OXYCODONE-ACETAMINOPHEN 5-325 MG PO TABS: 1.0000 | ORAL_TABLET | ORAL | 0 refills | Status: AC | PRN

## 2021-07-04 MED ORDER — OXYCODONE HCL 5 MG PO TABS
ORAL_TABLET | ORAL | Status: AC
  Filled 2021-07-04: qty 1

## 2021-07-04 MED ORDER — CLINDAMYCIN PHOSPHATE 900 MG/50ML IV SOLN
INTRAVENOUS | Status: DC | PRN
  Administered 2021-07-04: 900 mg via INTRAVENOUS

## 2021-07-04 MED ORDER — CLINDAMYCIN PHOSPHATE 900 MG/50ML IV SOLN
INTRAVENOUS | Status: AC
  Filled 2021-07-04: qty 50

## 2021-07-04 MED ORDER — FENTANYL CITRATE (PF) 100 MCG/2ML IJ SOLN
INTRAMUSCULAR | Status: DC | PRN
  Administered 2021-07-04: 100 ug via INTRAVENOUS

## 2021-07-04 MED ORDER — PROMETHAZINE HCL 25 MG/ML IJ SOLN: 6.2500 mg | INTRAMUSCULAR | Status: DC | PRN

## 2021-07-04 SURGICAL SUPPLY — 31 items
ATTRACTOMAT 16X20 MAGNETIC DRP (DRAPES) IMPLANT
CANISTER SUCT 1200ML W/VALVE (MISCELLANEOUS) ×2 IMPLANT
COAGULATOR SUCT 8FR VV (MISCELLANEOUS) ×2 IMPLANT
DECANTER SPIKE VIAL GLASS SM (MISCELLANEOUS) IMPLANT
DEFOGGER MIRROR 1QT (MISCELLANEOUS) ×2 IMPLANT
DRSG NASOPORE 8CM (GAUZE/BANDAGES/DRESSINGS) IMPLANT
DRSG TELFA 3X8 NADH (GAUZE/BANDAGES/DRESSINGS) IMPLANT
ELECT REM PT RETURN 9FT ADLT (ELECTROSURGICAL) ×2
ELECTRODE REM PT RTRN 9FT ADLT (ELECTROSURGICAL) ×1 IMPLANT
GLOVE SURG ENC MOIS LTX SZ7.5 (GLOVE) ×2 IMPLANT
GLOVE SURG POLYISO LF SZ6.5 (GLOVE) ×2 IMPLANT
GLOVE SURG UNDER POLY LF SZ7 (GLOVE) ×2 IMPLANT
GOWN STRL REUS W/ TWL LRG LVL3 (GOWN DISPOSABLE) ×2 IMPLANT
GOWN STRL REUS W/TWL LRG LVL3 (GOWN DISPOSABLE) ×4
NEEDLE HYPO 25X1 1.5 SAFETY (NEEDLE) ×2 IMPLANT
NS IRRIG 1000ML POUR BTL (IV SOLUTION) ×2 IMPLANT
PACK BASIN DAY SURGERY FS (CUSTOM PROCEDURE TRAY) ×2 IMPLANT
PACK ENT DAY SURGERY (CUSTOM PROCEDURE TRAY) ×2 IMPLANT
SLEEVE SCD COMPRESS KNEE MED (STOCKING) ×2 IMPLANT
SPLINT NASAL AIRWAY SILICONE (MISCELLANEOUS) ×2 IMPLANT
SPONGE GAUZE 2X2 8PLY STRL LF (GAUZE/BANDAGES/DRESSINGS) ×2 IMPLANT
SPONGE NEURO XRAY DETECT 1X3 (DISPOSABLE) ×2 IMPLANT
SUT CHROMIC 4 0 P 3 18 (SUTURE) ×2 IMPLANT
SUT PLAIN 4 0 ~~LOC~~ 1 (SUTURE) ×2 IMPLANT
SUT PROLENE 3 0 PS 2 (SUTURE) ×2 IMPLANT
SUT VIC AB 4-0 P-3 18XBRD (SUTURE) IMPLANT
SUT VIC AB 4-0 P3 18 (SUTURE)
TOWEL GREEN STERILE FF (TOWEL DISPOSABLE) ×2 IMPLANT
TUBE SALEM SUMP 12R W/ARV (TUBING) IMPLANT
TUBE SALEM SUMP 16 FR W/ARV (TUBING) ×2 IMPLANT
YANKAUER SUCT BULB TIP NO VENT (SUCTIONS) ×2 IMPLANT

## 2021-07-04 NOTE — Anesthesia Procedure Notes (Signed)
Procedure Name: Intubation Date/Time: 07/04/2021 9:19 AM Performed by: Verita Lamb, CRNA Pre-anesthesia Checklist: Patient identified, Emergency Drugs available, Suction available and Patient being monitored Patient Re-evaluated:Patient Re-evaluated prior to induction Oxygen Delivery Method: Circle system utilized Preoxygenation: Pre-oxygenation with 100% oxygen Induction Type: IV induction Ventilation: Mask ventilation without difficulty Laryngoscope Size: Mac and 3 Grade View: Grade I Tube type: Oral Tube size: 7.0 mm Number of attempts: 1 Airway Equipment and Method: Stylet and Oral airway Placement Confirmation: ETT inserted through vocal cords under direct vision, positive ETCO2, breath sounds checked- equal and bilateral and CO2 detector Secured at: 22 cm Tube secured with: Tape Dental Injury: Teeth and Oropharynx as per pre-operative assessment

## 2021-07-04 NOTE — Transfer of Care (Signed)
Immediate Anesthesia Transfer of Care Note  Patient: Matthew Jensen  Procedure(s) Performed: NASAL SEPTOPLASTY WITH TURBINATE REDUCTION (Bilateral: Nose)  Patient Location: PACU  Anesthesia Type:General  Level of Consciousness: awake, alert  and oriented  Airway & Oxygen Therapy: Patient Spontanous Breathing and Patient connected to face mask oxygen  Post-op Assessment: Report given to RN and Post -op Vital signs reviewed and stable  Post vital signs: Reviewed and stable  Last Vitals:  Vitals Value Taken Time  BP    Temp    Pulse    Resp    SpO2      Last Pain:  Vitals:   07/04/21 0828  TempSrc: Oral  PainSc: 0-No pain      Patients Stated Pain Goal: 5 (07/04/21 0828)  Complications: No notable events documented.

## 2021-07-04 NOTE — Op Note (Signed)
DATE OF PROCEDURE: 07/04/2021  OPERATIVE REPORT   SURGEON: Newman Pies, MD   PREOPERATIVE DIAGNOSES:  1. Severe nasal septal deviation.  2. Bilateral inferior turbinate hypertrophy.  3. Chronic nasal obstruction.  POSTOPERATIVE DIAGNOSES:  1. Severe nasal septal deviation.  2. Bilateral inferior turbinate hypertrophy.  3. Chronic nasal obstruction.  PROCEDURE PERFORMED:  1. Septoplasty.  2. Bilateral partial inferior turbinate resection.   ANESTHESIA: General endotracheal tube anesthesia.   COMPLICATIONS: None.   ESTIMATED BLOOD LOSS: 100 mL.   INDICATION FOR PROCEDURE: Matthew Jensen is a 35 y.o. male with a history of chronic nasal obstruction. The patient was treated with antihistamine, decongestant, and steroid nasal sprays. However, the patient continued to be symptomatic, after a brief improvement from the medical treatment. On examination, the patient was noted to have bilateral severe inferior turbinate hypertrophy and significant nasal septal deviation, causing significant (>95%) nasal obstruction. Based on the above findings, the decision was made for the patient to undergo the above-stated procedures. The risks, benefits, alternatives, and details of the procedures were discussed with the patient. Questions were invited and answered. Informed consent was obtained.   DESCRIPTION OF PROCEDURE: The patient was taken to the operating room and placed supine on the operating table. General endotracheal tube anesthesia was administered by the anesthesiologist. The patient was positioned, and prepped and draped in the standard fashion for nasal surgery. Pledgets soaked with Afrin were placed in both nasal cavities for decongestion. The pledgets were subsequently removed.   Examination of the nasal cavity revealed a severe nasal septal deviation. 1% lidocaine with 1:100,000 epinephrine was injected onto the nasal septum bilaterally. A hemitransfixion incision was made on the left side.  The mucosal flap was carefully elevated on the left side. A cartilaginous incision was made 1 cm superior to the caudal margin of the nasal septum. Mucosal flap was also elevated on the right side in the similar fashion. It should be noted that due to the severe septal deviation, the deviated portion of the cartilaginous and bony septum had to be removed in piecemeal fashion. Once the deviated portions were removed, a straight midline septum was achieved. The septum was then quilted with 4-0 plain gut sutures. The hemitransfixion incision was closed with interrupted 4-0 chromic sutures.   The inferior one half of both hypertrophied inferior turbinate was crossclamped with a Kelly clamp. The inferior one half of each inferior turbinate was then resected with a pair of cross cutting scissors. Hemostasis was achieved with a suction cautery device. Doyle splints were applied to the nasal septum.  The care of the patient was turned over to the anesthesiologist. The patient was awakened from anesthesia without difficulty. The patient was extubated and transferred to the recovery room in good condition.   OPERATIVE FINDINGS: Severe nasal septal deviation and bilateral inferior turbinate hypertrophy.   SPECIMEN: None.   FOLLOWUP CARE: The patient be discharged home once he is awake and alert. The patient will be placed on Percocet 1 tablets p.o. q.4 hours p.r.n. pain, and amoxicillin 875 mg p.o. b.i.d. for 3 days. The patient will follow up in my office in 3 days for splint removal.   Jomar Denz Philomena Doheny, MD

## 2021-07-04 NOTE — Anesthesia Postprocedure Evaluation (Signed)
Anesthesia Post Note  Patient: Matthew Jensen  Procedure(s) Performed: NASAL SEPTOPLASTY WITH TURBINATE REDUCTION (Bilateral: Nose)     Patient location during evaluation: PACU Anesthesia Type: General Level of consciousness: awake and alert and oriented Pain management: pain level controlled Vital Signs Assessment: post-procedure vital signs reviewed and stable Respiratory status: spontaneous breathing, nonlabored ventilation and respiratory function stable Cardiovascular status: blood pressure returned to baseline Postop Assessment: no apparent nausea or vomiting Anesthetic complications: no   No notable events documented.  Last Vitals:  Vitals:   07/04/21 1115 07/04/21 1141  BP: (!) 126/55 126/76  Pulse: (!) 56 61  Resp: 12 20  Temp:  36.8 C  SpO2: 95% 100%    Last Pain:  Vitals:   07/04/21 1141  TempSrc: Oral  PainSc: 4                  Shanda Howells

## 2021-07-04 NOTE — Discharge Instructions (Addendum)
No Tylenol until 2:35 today if needed  Post Anesthesia Home Care Instructions  Activity: Get plenty of rest for the remainder of the day. A responsible individual must stay with you for 24 hours following the procedure.  For the next 24 hours, DO NOT: -Drive a car -Advertising copywriter -Drink alcoholic beverages -Take any medication unless instructed by your physician -Make any legal decisions or sign important papers.  Meals: Start with liquid foods such as gelatin or soup. Progress to regular foods as tolerated. Avoid greasy, spicy, heavy foods. If nausea and/or vomiting occur, drink only clear liquids until the nausea and/or vomiting subsides. Call your physician if vomiting continues.  Special Instructions/Symptoms: Your throat may feel dry or sore from the anesthesia or the breathing tube placed in your throat during surgery. If this causes discomfort, gargle with warm salt water. The discomfort should disappear within 24 hours.  If you had a scopolamine patch placed behind your ear for the management of post- operative nausea and/or vomiting:  1. The medication in the patch is effective for 72 hours, after which it should be removed.  Wrap patch in a tissue and discard in the trash. Wash hands thoroughly with soap and water. 2. You may remove the patch earlier than 72 hours if you experience unpleasant side effects which may include dry mouth, dizziness or visual disturbances. 3. Avoid touching the patch. Wash your hands with soap and water after contact with the patch.     -------------  POSTOPERATIVE INSTRUCTIONS FOR PATIENTS HAVING NASAL OR SINUS OPERATIONS ACTIVITY: Restrict activity at home for the first two days, resting as much as possible. Light activity is best. You may usually return to work within a week. You should refrain from nose blowing, strenuous activity, or heavy lifting greater than 20lbs for a total of one week after your operation.  If sneezing cannot be  avoided, sneeze with your mouth open. DISCOMFORT: You may experience a dull headache and pressure along with nasal congestion and discharge. These symptoms may be worse during the first week after the operation but may last as long as two to four weeks.  Please take Tylenol or the pain medication that has been prescribed for you. Do not take aspirin or aspirin containing medications since they may cause bleeding.  You may experience symptoms of post nasal drainage, nasal congestion, headaches and fatigue for two or three months after your operation.  BLEEDING: You may have some blood tinged nasal drainage for approximately two weeks after the operation.  The discharge will be worse for the first week.  Please call our office at [phone] or go to the nearest hospital emergency room if you experience any of the following: heavy, bright red blood from your nose or mouth that lasts longer than 15 minutes or coughing up or vomiting bright red blood or blood clots. GENERAL CONSIDERATIONS: A gauze dressing will be placed on your upper lip to absorb any drainage after the operation. You may need to change this several times a day.  If you do not have very much drainage, you may remove the dressing.  Remember that you may gently wipe your nose with a tissue and sniff in, but DO NOT blow your nose. Please keep all of your postoperative appointments.  Your final results after the operation will depend on proper follow-up.  The initial visit is usually 2 to 5 days after the operation.  During this visit, the remaining nasal packing and internal septal splints will be  removed.  Your nasal and sinus cavities will be cleaned.  During the second visit, your nasal and sinus cavities will be cleaned again. Have someone drive you to your first two postoperative appointments.  How you care for your nose after the operation will influence the results that you obtain.  You should follow all directions, take your medication as  prescribed, and call our office [phone] with any problems or questions. You may be more comfortable sleeping with your head elevated on two pillows. Do not take any medications that we have not prescribed or recommended. WARNING SIGNS: if any of the following should occur, please call our office: Persistent fever greater than 102F. Persistent vomiting. Severe and constant pain that is not relieved by prescribed pain medication. Trauma to the nose. Rash or unusual side effects from any medicines.

## 2021-07-04 NOTE — Anesthesia Preprocedure Evaluation (Signed)
Anesthesia Evaluation  Patient identified by MRN, date of birth, ID band Patient awake    Reviewed: Allergy & Precautions, NPO status , Patient's Chart, lab work & pertinent test results  History of Anesthesia Complications Negative for: history of anesthetic complications  Airway Mallampati: II  TM Distance: >3 FB Neck ROM: Full    Dental no notable dental hx.    Pulmonary asthma ,    Pulmonary exam normal        Cardiovascular negative cardio ROS Normal cardiovascular exam     Neuro/Psych negative neurological ROS  negative psych ROS   GI/Hepatic negative GI ROS, Neg liver ROS,   Endo/Other  negative endocrine ROS  Renal/GU negative Renal ROS  negative genitourinary   Musculoskeletal negative musculoskeletal ROS (+)   Abdominal   Peds  Hematology negative hematology ROS (+)   Anesthesia Other Findings Deviated nasal septum  Reproductive/Obstetrics negative OB ROS                             Anesthesia Physical Anesthesia Plan  ASA: 2  Anesthesia Plan: General   Post-op Pain Management: Tylenol PO (pre-op)   Induction: Intravenous  PONV Risk Score and Plan: 2 and Treatment may vary due to age or medical condition, Ondansetron, Dexamethasone and Midazolam  Airway Management Planned: Oral ETT  Additional Equipment: None  Intra-op Plan:   Post-operative Plan: Extubation in OR  Informed Consent: I have reviewed the patients History and Physical, chart, labs and discussed the procedure including the risks, benefits and alternatives for the proposed anesthesia with the patient or authorized representative who has indicated his/her understanding and acceptance.     Dental advisory given  Plan Discussed with: CRNA  Anesthesia Plan Comments:         Anesthesia Quick Evaluation

## 2021-07-04 NOTE — H&P (Signed)
Cc: Chronic nasal obstruction  HPI: The patient is a 35 y/o male who returns today for follow up evaluation of chronic nasal obstruction. The patient was last seen 2 months ago. He was noted to have a severe left septal deviation with bilateral inferior turbinate hypertrophy. This caused significant nasal obstruction. The patient was placed on daily Flonase. He returns today noting a slight improvement in his nasal breathing. No facial pain or pressure is noted. He is using Flonase daily as recommended.   Exam: General: Communicates without difficulty, well nourished, no acute distress. Head: Normocephalic, no evidence injury, no tenderness, facial buttresses intact without stepoff. Eyes: PERRL, EOMI. No scleral icterus, conjunctivae clear. Neuro: CN II exam reveals vision grossly intact.  No nystagmus at any point of gaze. Ears: Auricles well formed without lesions.  Ear canals are intact without mass or lesion.  No erythema or edema is appreciated.  The TMs are intact without fluid. Nose: External evaluation reveals normal support and skin without lesions.  Dorsum is intact.  Anterior rhinoscopy reveals congested and edematous mucosa over anterior aspect of the inferior turbinates and nasal septum.  No purulence is noted. Middle meatus is not well visualized. Oral:  Oral cavity and oropharynx are intact, symmetric, without erythema or edema.  Mucosa is moist without lesions. Neck: Full range of motion without pain.  There is no significant lymphadenopathy.  No masses palpable.  Thyroid bed within normal limits to palpation.  Parotid glands and submandibular glands equal bilaterally without mass.  Trachea is midline. Neuro:  CN 2-12 grossly intact. Gait normal. Vestibular: No nystagmus at any point of gaze. A flexible scope was inserted into the right nasal cavity. Endoscopy of the interior nasal cavity, superior, inferior, and middle meatus was performed. The sphenoid-ethmoid recess was examined. Edematous  mucosa was noted. No polyp, mass, or lesion was appreciated. Olfactory cleft was clear. Nasopharynx was clear. Turbinates were hypertrophied but without mass. Incomplete response to decongestion. The procedure was repeated on the contralateral side with severe NSD. The patient tolerated the procedure well.   Assessment: 1. Severe left septal deviation is noted with bilateral inferior turbinate hypertrophy. This results in >95% nasal obstruction.  2. Slight decrease in soft tissue edema is noted with daily Flonase.   Plan:   1. Nasal endoscopy findings are reviewed with the patient.  2. Treatment options include conservative management with daily steroid nasal spray versus septoplasty and bilateral inferior turbinate reduction. The risks, benefits, alternatives, and details of the procedure are reviewed with the patient. Questions are invited and answered. 3. The patient would like to proceed with the procedures.

## 2021-07-07 ENCOUNTER — Encounter (HOSPITAL_BASED_OUTPATIENT_CLINIC_OR_DEPARTMENT_OTHER): Payer: Self-pay | Admitting: Otolaryngology

## 2021-07-11 DIAGNOSIS — J3089 Other allergic rhinitis: Secondary | ICD-10-CM

## 2021-07-12 NOTE — Progress Notes (Signed)
VIALS MADE. EXP 07-12-22 

## 2021-07-26 ENCOUNTER — Telehealth: Payer: Self-pay

## 2021-07-26 NOTE — Telephone Encounter (Signed)
Patient called stating he would like to stop taking allergy injections and he is wondering what would be the steps to do so.  Please Advise

## 2021-07-26 NOTE — Telephone Encounter (Signed)
Patients allergy flowsheet has been updated to reflect these changes. His vials have been marked DNO.

## 2021-07-26 NOTE — Telephone Encounter (Signed)
Please call patient and ask if he wants to finish his current vials or if he wants to stop completely?  He needs to come in to sign a paper (per the shot nurse).  Please put a do no order label on his vials.  Thank you.

## 2021-07-26 NOTE — Telephone Encounter (Signed)
Spoke to patient and he stated he's willing to complete his current vials he just didn't want any more made. He will come in this week for his next injection and will sign a discontinue form.    Matthew Jensen [phone]

## 2021-07-28 ENCOUNTER — Ambulatory Visit (INDEPENDENT_AMBULATORY_CARE_PROVIDER_SITE_OTHER): Payer: Managed Care, Other (non HMO) | Admitting: *Deleted

## 2021-07-28 DIAGNOSIS — J309 Allergic rhinitis, unspecified: Secondary | ICD-10-CM

## 2022-02-15 ENCOUNTER — Encounter: Payer: Self-pay | Admitting: Allergy

## 2022-02-15 NOTE — Progress Notes (Signed)
Reviewed notes from Dr. Suszanne Conners. Date of service: 01/26/2022. See scanned notes for full documentation. Had sinus surgery in December 2022 - doing better.
